# Patient Record
Sex: Female | Born: 1960 | Race: Black or African American | Marital: Single | Smoking: Current some day smoker
Health system: Southern US, Community
[De-identification: ages and names within clinical notes are randomized; demographics above are authoritative.]

## PROBLEM LIST (undated history)

## (undated) DIAGNOSIS — F1010 Alcohol abuse, uncomplicated: Secondary | ICD-10-CM

## (undated) DIAGNOSIS — R109 Unspecified abdominal pain: Secondary | ICD-10-CM

## (undated) DIAGNOSIS — K219 Gastro-esophageal reflux disease without esophagitis: Secondary | ICD-10-CM

## (undated) DIAGNOSIS — G629 Polyneuropathy, unspecified: Secondary | ICD-10-CM

## (undated) DIAGNOSIS — K7460 Unspecified cirrhosis of liver: Secondary | ICD-10-CM

## (undated) DIAGNOSIS — G8929 Other chronic pain: Secondary | ICD-10-CM

## (undated) DIAGNOSIS — F419 Anxiety disorder, unspecified: Secondary | ICD-10-CM

## (undated) DIAGNOSIS — F329 Major depressive disorder, single episode, unspecified: Secondary | ICD-10-CM

## (undated) DIAGNOSIS — F32A Depression, unspecified: Secondary | ICD-10-CM

## (undated) DIAGNOSIS — I509 Heart failure, unspecified: Secondary | ICD-10-CM

## (undated) HISTORY — DX: Polyneuropathy, unspecified: G62.9

## (undated) HISTORY — DX: Major depressive disorder, single episode, unspecified: F32.9

## (undated) HISTORY — DX: Depression, unspecified: F32.A

## (undated) HISTORY — DX: Heart failure, unspecified: I50.9

## (undated) HISTORY — DX: Gastro-esophageal reflux disease without esophagitis: K21.9

## (undated) HISTORY — PX: TUBAL LIGATION: SHX77

## (undated) HISTORY — DX: Anxiety disorder, unspecified: F41.9

---

## 2015-06-13 DIAGNOSIS — E639 Nutritional deficiency, unspecified: Secondary | ICD-10-CM | POA: Insufficient documentation

## 2015-08-05 ENCOUNTER — Telehealth: Payer: Self-pay | Admitting: Gastroenterology

## 2015-08-05 DIAGNOSIS — R17 Unspecified jaundice: Secondary | ICD-10-CM | POA: Insufficient documentation

## 2015-08-05 NOTE — Telephone Encounter (Signed)
PT HAS OPV NEXT WEEK. NEEDS CMP AND CT ABD/PELVIS BEFORE HER APPT PREFERABLY TODAY OR MON.

## 2015-08-05 NOTE — Telephone Encounter (Signed)
Patient called in and asked if I could call and leave a message with her caretaker concerning her upcoming appt.I called and lm on Whitney Roy's machine at [phone].

## 2015-08-05 NOTE — Assessment & Plan Note (Signed)
NEEDS LABS/CT ASAP

## 2015-08-05 NOTE — Telephone Encounter (Signed)
I called patient and she does not have any insurance. She stated that she does not understand and for me to call her friend([phone]) he helps take care of her. i had to leave a message for him to call me back. She is scheduled for the CT scan on Monday 08/08/15 @ 5:00. She needs to pick up contrast.

## 2015-08-08 ENCOUNTER — Ambulatory Visit (HOSPITAL_COMMUNITY)
Admission: RE | Admit: 2015-08-08 | Discharge: 2015-08-08 | Disposition: A | Discharge status: Home / Self Care | Payer: Medicaid Other | Source: Ambulatory Visit | Attending: Gastroenterology | Admitting: Gastroenterology

## 2015-08-08 DIAGNOSIS — R938 Abnormal findings on diagnostic imaging of other specified body structures: Secondary | ICD-10-CM | POA: Diagnosis not present

## 2015-08-08 DIAGNOSIS — K766 Portal hypertension: Secondary | ICD-10-CM | POA: Diagnosis not present

## 2015-08-08 DIAGNOSIS — R932 Abnormal findings on diagnostic imaging of liver and biliary tract: Secondary | ICD-10-CM | POA: Insufficient documentation

## 2015-08-08 DIAGNOSIS — R17 Unspecified jaundice: Secondary | ICD-10-CM

## 2015-08-08 MED ORDER — IOHEXOL 300 MG/ML  SOLN
100.0000 mL | Freq: Once | INTRAMUSCULAR | Status: AC | PRN
  Administered 2015-08-08: 100 mL via INTRAVENOUS

## 2015-08-09 NOTE — Progress Notes (Signed)
Quick Note:    Noted    ______

## 2015-08-10 ENCOUNTER — Ambulatory Visit (INDEPENDENT_AMBULATORY_CARE_PROVIDER_SITE_OTHER): Payer: Medicaid Other | Admitting: Gastroenterology

## 2015-08-10 ENCOUNTER — Encounter: Payer: Self-pay | Admitting: Gastroenterology

## 2015-08-10 VITALS — BP 112/55 | HR 85 | Temp 98.2°F | Ht 62.0 in | Wt 116.0 lb

## 2015-08-10 DIAGNOSIS — R7989 Other specified abnormal findings of blood chemistry: Secondary | ICD-10-CM

## 2015-08-10 DIAGNOSIS — D649 Anemia, unspecified: Secondary | ICD-10-CM | POA: Diagnosis not present

## 2015-08-10 DIAGNOSIS — R17 Unspecified jaundice: Secondary | ICD-10-CM | POA: Diagnosis not present

## 2015-08-10 DIAGNOSIS — R945 Abnormal results of liver function studies: Principal | ICD-10-CM

## 2015-08-11 ENCOUNTER — Encounter: Payer: Self-pay | Admitting: Gastroenterology

## 2015-08-11 ENCOUNTER — Telehealth: Payer: Self-pay | Admitting: Gastroenterology

## 2015-08-11 ENCOUNTER — Other Ambulatory Visit: Payer: Self-pay | Admitting: Gastroenterology

## 2015-08-11 DIAGNOSIS — D649 Anemia, unspecified: Secondary | ICD-10-CM | POA: Insufficient documentation

## 2015-08-11 LAB — IRON AND TIBC
%SAT: 52 % (ref 20–55)
IRON: 153 ug/dL — AB (ref 42–145)
TIBC: 295 ug/dL (ref 250–470)
UIBC: 142 ug/dL (ref 125–400)

## 2015-08-11 LAB — FERRITIN: FERRITIN: 249 ng/mL (ref 10–291)

## 2015-08-11 NOTE — Telephone Encounter (Signed)
Per Whitney Roy. She got the labs.

## 2015-08-11 NOTE — Progress Notes (Signed)
Primary Care Physician:  Wendee Beavers, NP Primary Gastroenterologist:  Dr. Oneida Alar   Chief Complaint  Patient presents with  . Jaundice    HPI:   Whitney Roy is a 54 y.o. female presenting today at the request of her PCP and Dr. Arnoldo Morale secondary to elevated LFTs, jaundice, cirrhosis. CT completed prior to appt on 8/22 showing a cirrhotic liver, portal venous collaterals and possible small esophageal varices. Unable to exclude hepatic fibrosis or acute hepatitis. Mesenteric, retroperitoneal and subcutaneous edema likely reflecting anasarca. NORMAL CBD and pancreatic duct.   US abdomen performed June 2016 with gallbladder sludge, thickened and irregular. Questionable nodular contour. Patent portal vein. Outside labs last done in May 2016 Tbili 9.6, ALT 11, AST 38, Alk Phos 102, Albumin 3.3, Na 132, Potassium 3.7, Hgb 9.9, Hct 30.5, MCV 103, Plts 181.   Poor historian. Family friend present with her. Notes abdominal swelling, chronic. Denies drinking ETOH for about a year. Prior to this would have about 4-5 beers per day. Denies drugs, prior blood transfusions, tattoos. Notes diffuse abdominal discomfort, waking her from sleep. Chronic. Notable confusion. Has noted yellowing of her eyes, urine is brown. Intermittent constipation, taking laxatives. Chronic reflux, improved with Zantac. No mlenea, no rectal bleeding. No prior colonoscopy/EGD. Appetite improved over the past month. Was weighing as little as 95 lbs but now back up to 116. Baseline 120s.   Past Medical History  Diagnosis Date  . CHF (congestive heart failure)   . GERD (gastroesophageal reflux disease)   . Neuropathy   . Anxiety   . Depression     Past Surgical History  Procedure Laterality Date  . Tubal ligation      Current Outpatient Prescriptions  Medication Sig Dispense Refill  . amitriptyline (ELAVIL) 10 MG tablet Take 10 mg by mouth at bedtime.    . Cholecalciferol (VITAMIN D-3 PO) Take by mouth.    .  citalopram (CELEXA) 20 MG tablet Take 20 mg by mouth.    . furosemide (LASIX) 20 MG tablet daily.     No current facility-administered medications for this visit.    Allergies as of 08/10/2015 - never reviewed  Allergen Reaction Noted  . Tylenol [acetaminophen]  08/08/2015    Family History  Problem Relation Age of Onset  . Colon cancer Father     unsure age of diagnosis  . Liver disease Neg Hx     Social History   Social History  . Marital Status: Unknown    Spouse Name: N/A  . Number of Children: N/A  . Years of Education: N/A   Occupational History  . Not on file.   Social History Main Topics  . Smoking status: Current Some Day Smoker    Types: Cigarettes  . Smokeless tobacco: Not on file  . Alcohol Use: 0.0 oz/week    0 Standard drinks or equivalent per week     Comment: SOBER For one year. Used to drink 4-5 beers daily.   . Drug Use: No  . Sexual Activity: Not on file   Other Topics Concern  . Not on file   Social History Narrative    Review of Systems: Gen: see HPI, +chills CV: Denies chest pain, heart palpitations, peripheral edema, syncope.  Resp: +DOE GI: see HPI GU : Denies urinary burning, urinary frequency, urinary hesitancy MS: Denies joint pain, muscle weakness, cramps, or limitation of movement.  Derm: Denies rash, itching, dry skin Psych: +depression, confusion  Heme: Denies bruising, bleeding, and enlarged lymph  nodes.  Physical Exam: BP 112/55 mmHg  Pulse 85  Temp(Src) 98.2 F (36.8 C) (Oral)  Ht 5' 2"  (1.575 m)  Wt 116 lb (52.617 kg)  BMI 21.21 kg/m2 General:   Alert and oriented. Appears chronically ill.  Head:  Normocephalic and atraumatic. Eyes:  +scleral icterus.  Ears:  Normal auditory acuity. Nose:  No deformity, discharge,  or lesions. Mouth:  No deformity or lesions, oral mucosa pink.  Lungs:  Clear to auscultation bilaterally. No wheezes, rales, or rhonchi. No distress.  Heart:  S1, S2 present without murmurs  appreciated.  Abdomen:  +BS, soft, rounded, questionable non-tense ascites, anasarca.+HSM Rectal:  Deferred  Msk:  Shuffling gait, using walker Extremities:  Without  edema. Neurologic:  Alert and  oriented x4;  grossly normal neurologically. Psych:  Alert and cooperative. Normal mood and affect.   CT Aug 2016:  IMPRESSION: 1. Cirrhotic changes involving the liver with portal venous hypertension, portal venous collaterals and possible small esophageal varices. 2. Patchy areas of low-attenuation in the liver suspicious for fatty change but could not exclude confluent hepatic fibrosis or acute hepatitis. MRI abdomen without and with contrast may be helpful for further evaluation. 3. No overt ascites but there is mesenteric and retroperitoneal and subcutaneous edema which could reflect anasarca are related to the patient's liver disease. 4. Normal common bile duct and pancreatic duct. No obvious gallstones. 5. Suspect exophytic uterine fibroids.

## 2015-08-11 NOTE — Telephone Encounter (Signed)
Patient was seen yesterday in clinic at 3:30pm. The computers were down, so I was unable to get any labs ordered. They are going today.  Please fax labs to [facility]. I have printed them out. Thanks!

## 2015-08-11 NOTE — Telephone Encounter (Signed)
The lab is aware to do all of the labs.

## 2015-08-11 NOTE — Telephone Encounter (Signed)
I have ordered iron studies as well. Please fax. I forgot to order this. Thanks!

## 2015-08-11 NOTE — Telephone Encounter (Signed)
Pt was seen yesterday.

## 2015-08-12 LAB — PROTIME-INR
INR: 2.2 — ABNORMAL HIGH (ref <1.50)
Prothrombin Time: 24.4 seconds — ABNORMAL HIGH (ref 11.6–15.2)

## 2015-08-12 LAB — CBC WITH DIFFERENTIAL/PLATELET
BASOS ABS: 0.3 10*3/uL — AB (ref 0.0–0.1)
Basophils Relative: 3 % — ABNORMAL HIGH (ref 0–1)
Eosinophils Absolute: 0.6 10*3/uL (ref 0.0–0.7)
Eosinophils Relative: 7 % — ABNORMAL HIGH (ref 0–5)
HEMATOCRIT: 27.9 % — AB (ref 36.0–46.0)
Hemoglobin: 9.9 g/dL — ABNORMAL LOW (ref 12.0–15.0)
LYMPHS ABS: 1.9 10*3/uL (ref 0.7–4.0)
LYMPHS PCT: 23 % (ref 12–46)
MCH: 32.1 pg (ref 26.0–34.0)
MCHC: 35.5 g/dL (ref 30.0–36.0)
MCV: 90.6 fL (ref 78.0–100.0)
MPV: 10.1 fL (ref 8.6–12.4)
Monocytes Absolute: 0.8 10*3/uL (ref 0.1–1.0)
Monocytes Relative: 10 % (ref 3–12)
NEUTROS PCT: 57 % (ref 43–77)
Neutro Abs: 4.8 10*3/uL (ref 1.7–7.7)
PLATELETS: 102 10*3/uL — AB (ref 150–400)
RBC: 3.08 MIL/uL — ABNORMAL LOW (ref 3.87–5.11)
RDW: 15.7 % — ABNORMAL HIGH (ref 11.5–15.5)
WBC: 8.4 10*3/uL (ref 4.0–10.5)

## 2015-08-12 LAB — HEPATITIS PANEL, ACUTE
HCV Ab: REACTIVE — AB
HEP A IGM: NONREACTIVE
HEP B C IGM: NONREACTIVE
HEP B S AG: NEGATIVE

## 2015-08-12 LAB — COMPLETE METABOLIC PANEL WITH GFR
ALT: 14 U/L (ref 6–29)
AST: 47 U/L — ABNORMAL HIGH (ref 10–35)
Albumin: 3.4 g/dL — ABNORMAL LOW (ref 3.6–5.1)
Alkaline Phosphatase: 202 U/L — ABNORMAL HIGH (ref 33–130)
BUN: 10 mg/dL (ref 7–25)
CHLORIDE: 101 mmol/L (ref 98–110)
CO2: 24 mmol/L (ref 20–31)
Calcium: 9.8 mg/dL (ref 8.6–10.4)
Creat: 0.55 mg/dL (ref 0.50–1.05)
Glucose, Bld: 92 mg/dL (ref 65–99)
Potassium: 4.3 mmol/L (ref 3.5–5.3)
Sodium: 132 mmol/L — ABNORMAL LOW (ref 135–146)
Total Bilirubin: 12.1 mg/dL — ABNORMAL HIGH (ref 0.2–1.2)
Total Protein: 7.7 g/dL (ref 6.1–8.1)

## 2015-08-12 LAB — ANTI-SMOOTH MUSCLE ANTIBODY, IGG: Smooth Muscle Ab: 24 U — ABNORMAL HIGH (ref <20)

## 2015-08-12 LAB — IGG, IGA, IGM
IGA: 561 mg/dL — AB (ref 69–380)
IGM, SERUM: 605 mg/dL — AB (ref 52–322)
IgG (Immunoglobin G), Serum: 2480 mg/dL — ABNORMAL HIGH (ref 690–1700)

## 2015-08-12 LAB — ANA: ANA: NEGATIVE

## 2015-08-15 LAB — HEPATITIS C RNA QUANTITATIVE

## 2015-08-15 LAB — MITOCHONDRIAL ANTIBODIES: MITOCHONDRIAL M2 AB, IGG: 1.1 — AB (ref <0.91)

## 2015-08-16 ENCOUNTER — Encounter: Payer: Self-pay | Admitting: Gastroenterology

## 2015-08-16 DIAGNOSIS — R945 Abnormal results of liver function studies: Principal | ICD-10-CM

## 2015-08-16 DIAGNOSIS — R7989 Other specified abnormal findings of blood chemistry: Secondary | ICD-10-CM | POA: Insufficient documentation

## 2015-08-16 MED ORDER — LACTULOSE 10 GM/15ML PO SOLN: 10.0000 g | Freq: Three times a day (TID) | ORAL | Status: DC

## 2015-08-16 NOTE — Assessment & Plan Note (Signed)
Query chronic disease +/- iron deficiency. No overt GI bleeding. Check anemia panel now. No prior colonoscopy, and POSITIVE FAMILY HISTORY OF COLON CANCER IN FATHER, onset of disease unknown. Needs colonoscopy at time of EGD if clinically able to tolerate.

## 2015-08-16 NOTE — Assessment & Plan Note (Addendum)
54 year old female with significantly elevated bilirubin and CT findings of cirrhosis in the setting of historical ETOH abuse; other etiologies for cirrhosis including viral hepatitis, autoimmune etiology need to be investigated. Appears last bloodwork done in May. We will repeat HFP today, add INR, CBC, autoimmune serologies, viral markers, and INR. Ultimately needs EGD for variceal assessment, which will need to be done with Propofol. Intermittent confusion, most likely encephalopathy based on interaction with patient today and reported cognitive history and caretaker reports. Start Lactulose BID with goal of 2-3 soft BMs daily.

## 2015-08-16 NOTE — Progress Notes (Signed)
CC'ED TO PCP 

## 2015-08-16 NOTE — Progress Notes (Signed)
Quick Note:  I called [facility] and spoke to [place] who added the ceruloplasmin to the labs. I spoke to Gerrit Halls, NP and she is gong to call and speak with the pt. ______

## 2015-08-16 NOTE — Progress Notes (Signed)
Quick Note:  MELD 25. She has advanced liver disease. She needs a referral for transplant evaluation ASAP to [place], [place], or [place], whichever she is able to get to.  HOLD on EGD and colonoscopy right now. This can be done at the transplant center. Her INR is in the 2 range, indicating advanced liver disease.  Please add on ceruloplasmin to labs if able. If not, needs to have this drawn. ______

## 2015-08-16 NOTE — Progress Notes (Signed)
Quick Note:  I was unable to speak to patient directly, but I spoke with Kris Mouton, contact person for patient. He is attempting to get insurance worked out for patient. PLEASE REFER TO [facility] ASAP, TRANSPLANT EVALUATION. PLEASE PUT ON REFERRAL SHEET THAT HER MELD SCORE IS 25.  Patient needs to be seen in 2 weeks here. ______

## 2015-08-16 NOTE — Addendum Note (Signed)
Addended by: Nira Retort on: 08/16/2015 04:40 PM   Modules accepted: Orders

## 2015-08-17 NOTE — Progress Notes (Signed)
Quick Note:  Forwarding to [facility] to make referral and to [facility] to make appt here in 2 weeks. ______

## 2015-08-17 NOTE — Progress Notes (Signed)
APPOINTMENT MADE AND GIVEN TO CONTACT IN SYSTEM, Whitney Roy.  HE IS AWARE OF DATE AND TIME.

## 2015-08-18 LAB — CERULOPLASMIN: CERULOPLASMIN: 32 mg/dL (ref 18–53)

## 2015-08-24 NOTE — Progress Notes (Signed)
REFER PT TO [facility]. EXPLAIN TO PT SHE NEEDS TO OBTAIN INSURANCE.

## 2015-08-25 ENCOUNTER — Other Ambulatory Visit: Payer: Self-pay | Admitting: Gastroenterology

## 2015-08-25 MED ORDER — URSODIOL 250 MG PO TABS: 250.0000 mg | ORAL_TABLET | Freq: Two times a day (BID) | ORAL | Status: DC

## 2015-08-25 MED ORDER — PREDNISONE 10 MG PO TABS: 10.0000 mg | ORAL_TABLET | Freq: Every day | ORAL | Status: DC

## 2015-08-25 MED ORDER — URSODIOL 250 MG PO TABS: 250.0000 mg | ORAL_TABLET | Freq: Two times a day (BID) | ORAL | Status: AC

## 2015-08-25 NOTE — Addendum Note (Signed)
Addended by: Nira Retort on: 08/25/2015 12:02 PM   Modules accepted: Orders

## 2015-08-25 NOTE — Progress Notes (Signed)
Quick Note:  I have sent Prednisone 10 mg daily and Urso 250 mg po BID to pharmacy. I am copying Raynelle Fanning on this. Prednisone should be inexpensive but urso will need patient assistance. ______

## 2015-08-26 NOTE — Progress Notes (Signed)
Quick Note:  I called and informed contact person, Kris Mouton of the prednisone and the paperwork that Raynelle Fanning is mailing for the URSO. ______

## 2015-08-30 ENCOUNTER — Ambulatory Visit: Payer: Self-pay | Admitting: Gastroenterology

## 2015-08-30 NOTE — Progress Notes (Signed)
Quick Note:  Ceruloplasmin normal. Doubt dealing with Wilson's disease. How is referral coming along? ______

## 2015-08-30 NOTE — Progress Notes (Signed)
Quick Note:  LMOM for pt and Kris Mouton for a return call. Ginger is checking on the referral process. ______

## 2015-08-31 ENCOUNTER — Other Ambulatory Visit: Payer: Self-pay

## 2015-08-31 ENCOUNTER — Ambulatory Visit (INDEPENDENT_AMBULATORY_CARE_PROVIDER_SITE_OTHER): Payer: Medicaid Other | Admitting: Gastroenterology

## 2015-08-31 ENCOUNTER — Encounter: Payer: Self-pay | Admitting: Gastroenterology

## 2015-08-31 VITALS — BP 136/60 | HR 90 | Temp 97.7°F | Ht 60.0 in | Wt 116.4 lb

## 2015-08-31 DIAGNOSIS — K7031 Alcoholic cirrhosis of liver with ascites: Secondary | ICD-10-CM

## 2015-08-31 DIAGNOSIS — K7030 Alcoholic cirrhosis of liver without ascites: Secondary | ICD-10-CM | POA: Insufficient documentation

## 2015-08-31 DIAGNOSIS — R188 Other ascites: Secondary | ICD-10-CM

## 2015-08-31 MED ORDER — LACTULOSE 10 GM/15ML PO SOLN: ORAL | Status: AC

## 2015-08-31 NOTE — Patient Instructions (Signed)
We have scheduled you for an ultrasound of your belly to see if any fluid can be removed. Please have your blood work done at [facility] lab across from the hospital on the day that you have your ultrasound done. I just don't want you to have to drive back and forth multiple times.   I have refilled lactulose to take 15mL to 30 mL three to four times a day with a goal of 2-3 soft bowel movements daily.

## 2015-08-31 NOTE — Progress Notes (Addendum)
Referring Provider: Newman Nip, NP Primary Care Physician:  Newman Nip, NP  Primary GI: Dr. Darrick Penna   Chief Complaint  Patient presents with  . Follow-up    HPI:   Whitney Roy is a 54 y.o. female presenting today with a history of decompensated cirrhosis, query an autoimmune component, notable hx of ETOH abuse now sober for 1 year, awaiting a visit at [facility] but has been difficult due to LACK OF INSURANCE. Friend is present with patient. Lenora Boys, contact at [employer]. Attempting to obtain Medicaid. Trying to complete forms for patient assistance. Started on low dose prednisone 10 mg daily in interim from last visit, unable to afford urso as it is 200 $ a month.   Was having 2 BMs per day but last few days more constipated. Had prune juice. Was taking lactulose TID but then stopped working. Sometimes abdominal pain wakes her up out of sleep. Feels tight. Abdomen feels more swollen. Actually appears better today than last visit. Still with intermittent confusion.   Past Medical History  Diagnosis Date  . CHF (congestive heart failure)   . GERD (gastroesophageal reflux disease)   . Neuropathy   . Anxiety   . Depression     Past Surgical History  Procedure Laterality Date  . Tubal ligation      Current Outpatient Prescriptions  Medication Sig Dispense Refill  . amitriptyline (ELAVIL) 10 MG tablet Take 10 mg by mouth at bedtime.    . Cholecalciferol (VITAMIN D-3 PO) Take by mouth.    . citalopram (CELEXA) 20 MG tablet Take 20 mg by mouth.    . lactulose (CHRONULAC) 10 GM/15ML solution Take 15ml to 30 ml three to four times a day to have 2-3 soft bowel movements. 1892 mL 3  . predniSONE (DELTASONE) 10 MG tablet Take 1 tablet (10 mg total) by mouth daily with breakfast. 90 tablet 3  . furosemide (LASIX) 20 MG tablet daily.    . ursodiol (URSO) 250 MG tablet Take 1 tablet (250 mg total) by mouth 2 (two) times daily. 60 tablet 3     No current facility-administered medications for this visit.    Allergies as of 08/31/2015 - Review Complete 08/31/2015  Allergen Reaction Noted  . Tylenol [acetaminophen]  08/08/2015    Family History  Problem Relation Age of Onset  . Colon cancer Father     unsure age of diagnosis  . Liver disease Neg Hx     Social History   Social History  . Marital Status: Single    Spouse Name: N/A  . Number of Children: N/A  . Years of Education: N/A   Social History Main Topics  . Smoking status: Current Some Day Smoker    Types: Cigarettes  . Smokeless tobacco: None  . Alcohol Use: 0.0 oz/week    0 Standard drinks or equivalent per week     Comment: SOBER For one year. Used to drink 4-5 beers daily.   . Drug Use: No  . Sexual Activity: Not Asked   Other Topics Concern  . None   Social History Narrative    Review of Systems: As mentioned in HPI   Physical Exam: BP 136/60 mmHg  Pulse 90  Temp(Src) 97.7 F (36.5 C)  Ht 5' (1.524 m)  Wt 116 lb 6.4 oz (52.799 kg)  BMI 22.73 kg/m2 General:   Alert and oriented. Appears chronically ill.  Head:  Normocephalic and atraumatic. Eyes:  Scleral icterus  Mouth:  Oral mucosa pink and moist.  Abdomen:  +BS, distended, mild to moderately tense. +fluid wave Msk:  Shuffled gait Extremities:  Without edema. Neurologic:  Alert and  oriented x4;  grossly normal neurologically. Psych:  Alert and cooperative. Normal mood and affect.  Lab Results  Component Value Date   WBC 8.4 08/11/2015   HGB 9.9* 08/11/2015   HCT 27.9* 08/11/2015   MCV 90.6 08/11/2015   PLT 102* 08/11/2015   Lab Results  Component Value Date   CREATININE 0.55 08/11/2015   BUN 10 08/11/2015   NA 132* 08/11/2015   K 4.3 08/11/2015   CL 101 08/11/2015   CO2 24 08/11/2015   Lab Results  Component Value Date   ALT 14 08/11/2015   AST 47* 08/11/2015   ALKPHOS 202* 08/11/2015   BILITOT 12.1* 08/11/2015   Lab Results  Component Value Date   INR 2.20*  08/11/2015

## 2015-09-01 ENCOUNTER — Telehealth: Payer: Self-pay | Admitting: General Practice

## 2015-09-01 NOTE — Telephone Encounter (Signed)
Disability paperwork is still pending and should have a decision by Monday or Tuesday of next week.  Whitney Roy [phone]

## 2015-09-01 NOTE — Telephone Encounter (Signed)
Gardenia Phlegm is the patient's social worker and Ashley([employer]) will call to follow-up on her disability application

## 2015-09-01 NOTE — Telephone Encounter (Signed)
I spoke with Whitney Roy at [facility] and she stated the patient has an application submitted for disability and [facility], however there has not been a decision made as of yet.

## 2015-09-02 ENCOUNTER — Other Ambulatory Visit: Payer: Self-pay | Admitting: Gastroenterology

## 2015-09-02 ENCOUNTER — Ambulatory Visit (HOSPITAL_COMMUNITY)
Admission: RE | Admit: 2015-09-02 | Discharge: 2015-09-02 | Disposition: A | Discharge status: Home / Self Care | Payer: Medicaid Other | Source: Ambulatory Visit | Attending: Gastroenterology | Admitting: Gastroenterology

## 2015-09-02 DIAGNOSIS — R188 Other ascites: Secondary | ICD-10-CM

## 2015-09-02 NOTE — Assessment & Plan Note (Addendum)
54 year old female with decompensated cirrhosis and in need of evaluation for liver transplant. Unfortunately, this has been quite a hurdle due to lack of insurance. Caretaker in process of Medicaid application with [place] [employer] Lenora Boys), and attempting to obtain [facility]. 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 Complex case and needs transplant evaluation as soon as possible. Per [facility] liver clinic, if patient requires hospitalization, would be best served going to the nearest ED with transplant capabilities, as this would enable her to be considered a candidate. Will continue to follow closely here for clinical changes and provide supportive care. Physical exam with concern for ascites. Due to most likely encephalopathy underlying with intermittent confusion, titrate lactulose dosing, which was started at last visit.   1. Increase lactulose dosing to 15-30 ml 3-4 times a day with goal of 2-3 bowel movements daily 2. [place] with para if ascites able to be tapped. Fluid analysis. Albumin 25 g IV at 4 liters 3. Recheck  INR, CMP 4. Continue prednisone 10 mg daily. Have attempted to obtain urso but without any breakthrough due to lack of insurance, lack of program assistance through company.

## 2015-09-03 LAB — COMPLETE METABOLIC PANEL WITH GFR
ALBUMIN: 3.4 g/dL — AB (ref 3.6–5.1)
ALK PHOS: 207 U/L — AB (ref 33–130)
ALT: 24 U/L (ref 6–29)
AST: 47 U/L — ABNORMAL HIGH (ref 10–35)
BILIRUBIN TOTAL: 12.8 mg/dL — AB (ref 0.2–1.2)
BUN: 10 mg/dL (ref 7–25)
CO2: 27 mmol/L (ref 20–31)
Calcium: 9 mg/dL (ref 8.6–10.4)
Chloride: 99 mmol/L (ref 98–110)
Creat: 0.63 mg/dL (ref 0.50–1.05)
Glucose, Bld: 129 mg/dL — ABNORMAL HIGH (ref 65–99)
POTASSIUM: 4 mmol/L (ref 3.5–5.3)
Sodium: 136 mmol/L (ref 135–146)
TOTAL PROTEIN: 7.7 g/dL (ref 6.1–8.1)

## 2015-09-03 LAB — PROTIME-INR
INR: 2.35 — AB (ref <1.50)
PROTHROMBIN TIME: 26.1 s — AB (ref 11.6–15.2)

## 2015-09-07 NOTE — Progress Notes (Signed)
CC'D TO PCP °

## 2015-09-07 NOTE — Telephone Encounter (Signed)
I called and lmom for Whitney Roy to f/u on the patient's application

## 2015-09-08 NOTE — Telephone Encounter (Signed)
Per Eileen Stanford she is still pending and should have an answer soon

## 2015-09-13 NOTE — Telephone Encounter (Signed)
Per Whitney Roy this process could take up to 6 months before a determination could be made from [employer]

## 2015-09-13 NOTE — Progress Notes (Signed)
Quick Note:  MELD 26, was 25 at first consultation. Tbili only slightly worse, overall LFTs remain similar. INR worsening. PATIENT NEEDS TRANSPLANT EVALUATION. I know she is waiting on Medicaid approval; Durward Mallard has been in contact with Maralyn Sago, Child psychotherapist. This patient needs insurance ASAP in some form. Needs to be seen at [facility]. Can we see what the status is of insurance? If I need to talk with Eileen Stanford, let me know. This case should be pushed to the forefront for medical purposes. ______

## 2015-09-13 NOTE — Telephone Encounter (Signed)
I tried to call the [facility] in [place] at [phone], however I was unable to reach anyone.  I will try back tomorrow.

## 2015-09-13 NOTE — Telephone Encounter (Signed)
I spoke with Whitney Roy at [employer] in regards to speeding up the process for [name]'s Medicaid application.

## 2015-09-13 NOTE — Telephone Encounter (Signed)
There is not a way to speed up this process

## 2015-09-14 ENCOUNTER — Other Ambulatory Visit: Payer: Self-pay

## 2015-09-14 ENCOUNTER — Encounter: Payer: Self-pay | Admitting: Gastroenterology

## 2015-09-14 DIAGNOSIS — K7460 Unspecified cirrhosis of liver: Secondary | ICD-10-CM

## 2015-09-14 NOTE — Telephone Encounter (Signed)
I spoke with Whitney Roy and informed of information thus far. He is thankful for all we have done. He knows about appt with me in Oct.

## 2015-09-14 NOTE — Progress Notes (Signed)
US abdomen on 9/16 reviewed. No evidence of ascites. With distended abdomen, most likely anasarca. Hypoalbuminemia and chronic disease playing a role. Anasarca noted on CT in Aug 08, 2015.

## 2015-09-14 NOTE — Telephone Encounter (Signed)
I tried to call the patient, no answer,lmom 

## 2015-09-14 NOTE — Telephone Encounter (Signed)
I spoke with Whitney Roy with [employer] at [phone] concerning Whitney Roy's claim #[id number].  I asked if my provider Gerrit Halls could speak with their doctor(Dr. Nelly Laurence) in regards to her claim.    Routing to [employer]

## 2015-09-14 NOTE — Telephone Encounter (Signed)
Patient is scheduled to see Tobi Bastos 10/24th at 11:30, Tyler Aas is going to mail labs to have INR drawn on 10/24th

## 2015-09-14 NOTE — Telephone Encounter (Signed)
Spoke with Annamarie Major, NP.   1. Patient will pay 50$ at liver clinic in [place] to see hepatologist BUT WILL STILL BE BILLED FOR REMAINDER.  2. Transplant work-up will be costly out of pocket and impossible without insurance. HOWEVER, getting her in at [place] with the hepatologist is best option as social work can get involved and help navigate the system. 3. For continued follow-ups, [facility] can see her BUT this would be completely out of pocket.  4. Could potentially consider Ocaliva 5 mg weekly, as she is unable to afford Delma Freeze and Prudencio Pair reportedly has patient assistance. HOWEVER:  this is indicated if failure of urso or intolerance to urso. I don't feel comfortable starting this without being seen in [place]. They may be able to offer better options. IF the appt takes awhile, I will talk with Dr. Darrick Penna about starting this medication now.   Bottom line: without insurance, the work-up and post-transplant care is impossible and her prognosis is poor. She needs insurance so she can continue with medications post-transplant. She needs evaluation ASAP regardless, and getting her in with a hepatologist at [facility] is the best option right now, with further management hopefully by [facility]. (This will be out of pocket unless she gets coverage). We will help where we can.

## 2015-09-14 NOTE — Telephone Encounter (Signed)
Lab order is being mailed to pt along with the appt for 10/10/2015 at 11:30 AM.  A letter to explain is enclosed.

## 2015-09-14 NOTE — Telephone Encounter (Signed)
I spoke with Whitney Roy and she is going to call Alvis Lemmings is [facility] in [place] to see about starting care there to manager her symptoms.

## 2015-09-14 NOTE — Telephone Encounter (Signed)
Letter has been faxed to Dr. Carleene Cooper, along with supporting documentation. Received call back from Dr. Carleene Cooper that according to policy, an INR must be greater than 1.5 on 2 separate occasions, 60 days apart, within past 6 months. Unfortunately, we do not have any record of an INR drawn prior to initial visit with [place].   She is going to do an "equals listing", meaning she will document that we can assume her INR was greater than 1.5 for at least 60 days prior. This may hold up the process. We will attempt this.   PLAN B: obtain repeat INR 60 days from 8/25, which will be around October 24th. Hopefully, the equal listing will work, but if not, we need an INR exactly on Oct 24th. Then, we can fax to Dr. Carleene Cooper.   HER DIRECT PHONE # IS: [phone].   Doris: let's just bring the patient in on Oct 24th with me. May use an urgent if needed.

## 2015-09-14 NOTE — Telephone Encounter (Signed)
I spoke with Edman Circle at [phone], financial counselor and she said that the patient could be seen in the [facility] Clinic and they can start managing the symptoms.  They will make her self pay and she will be responsible for a $50 payment for every office visit.  After that consultation they will determine if she is a candidate for transplant, however she will need to have some insurance.

## 2015-09-19 NOTE — Telephone Encounter (Signed)
I made Mr. Whitney Roy aware of Ms. [employer] approval.  He wanted to know if we can make the liver referral somewhere closer to home rather than [place].   Routing to clinical pool

## 2015-09-19 NOTE — Telephone Encounter (Signed)
Called Mr.Garland LMOM for him to call office back

## 2015-09-19 NOTE — Telephone Encounter (Signed)
WONDERFUL NEWS! Great team effort from everyone. This is a big deal and was quite a challenge. Proud of everyone's efforts.    Yes: she may go to a closer transplant center but needs to go ASAP.

## 2015-09-19 NOTE — Telephone Encounter (Signed)
I just received an email from [facility], stating the patient has been approved for Medicaid and coverage has been added to her record.  Tobi Bastos, would you like to see if we can rush her appt at the Liver Clinic in [place]?

## 2015-09-19 NOTE — Telephone Encounter (Signed)
Patient prefers Monday, Wednesday of Friday due to transportation issues.

## 2015-09-22 NOTE — Telephone Encounter (Signed)
Spoke with Mr Roanna Epley. He is aware.

## 2015-09-30 ENCOUNTER — Encounter (HOSPITAL_COMMUNITY): Payer: Self-pay | Admitting: Emergency Medicine

## 2015-09-30 ENCOUNTER — Emergency Department (HOSPITAL_COMMUNITY): Payer: Medicaid Other

## 2015-09-30 ENCOUNTER — Observation Stay (HOSPITAL_COMMUNITY)
Admission: EM | Admit: 2015-09-30 | Discharge: 2015-10-02 | Disposition: A | Discharge status: Home / Self Care | Payer: Medicaid Other | Attending: Family Medicine | Admitting: Family Medicine

## 2015-09-30 DIAGNOSIS — R410 Disorientation, unspecified: Secondary | ICD-10-CM

## 2015-09-30 DIAGNOSIS — Z720 Tobacco use: Secondary | ICD-10-CM | POA: Diagnosis not present

## 2015-09-30 DIAGNOSIS — R531 Weakness: Secondary | ICD-10-CM | POA: Diagnosis not present

## 2015-09-30 DIAGNOSIS — K7030 Alcoholic cirrhosis of liver without ascites: Secondary | ICD-10-CM | POA: Diagnosis present

## 2015-09-30 DIAGNOSIS — Z789 Other specified health status: Secondary | ICD-10-CM

## 2015-09-30 DIAGNOSIS — K7460 Unspecified cirrhosis of liver: Secondary | ICD-10-CM | POA: Insufficient documentation

## 2015-09-30 DIAGNOSIS — J159 Unspecified bacterial pneumonia: Secondary | ICD-10-CM | POA: Diagnosis not present

## 2015-09-30 DIAGNOSIS — Z7289 Other problems related to lifestyle: Secondary | ICD-10-CM | POA: Insufficient documentation

## 2015-09-30 DIAGNOSIS — K219 Gastro-esophageal reflux disease without esophagitis: Secondary | ICD-10-CM | POA: Diagnosis not present

## 2015-09-30 DIAGNOSIS — F329 Major depressive disorder, single episode, unspecified: Secondary | ICD-10-CM | POA: Insufficient documentation

## 2015-09-30 DIAGNOSIS — F1010 Alcohol abuse, uncomplicated: Secondary | ICD-10-CM

## 2015-09-30 DIAGNOSIS — G8929 Other chronic pain: Secondary | ICD-10-CM | POA: Insufficient documentation

## 2015-09-30 DIAGNOSIS — I509 Heart failure, unspecified: Secondary | ICD-10-CM | POA: Insufficient documentation

## 2015-09-30 DIAGNOSIS — J189 Pneumonia, unspecified organism: Secondary | ICD-10-CM

## 2015-09-30 DIAGNOSIS — G629 Polyneuropathy, unspecified: Secondary | ICD-10-CM | POA: Diagnosis not present

## 2015-09-30 DIAGNOSIS — G9340 Encephalopathy, unspecified: Secondary | ICD-10-CM

## 2015-09-30 DIAGNOSIS — F419 Anxiety disorder, unspecified: Secondary | ICD-10-CM | POA: Insufficient documentation

## 2015-09-30 DIAGNOSIS — R509 Fever, unspecified: Secondary | ICD-10-CM

## 2015-09-30 DIAGNOSIS — Z79899 Other long term (current) drug therapy: Secondary | ICD-10-CM | POA: Diagnosis not present

## 2015-09-30 DIAGNOSIS — R4182 Altered mental status, unspecified: Secondary | ICD-10-CM | POA: Diagnosis present

## 2015-09-30 HISTORY — DX: Unspecified abdominal pain: R10.9

## 2015-09-30 HISTORY — DX: Unspecified cirrhosis of liver: K74.60

## 2015-09-30 HISTORY — DX: Other chronic pain: G89.29

## 2015-09-30 LAB — URINE MICROSCOPIC-ADD ON

## 2015-09-30 LAB — URINALYSIS, ROUTINE W REFLEX MICROSCOPIC
Glucose, UA: NEGATIVE mg/dL
KETONES UR: NEGATIVE mg/dL
Leukocytes, UA: NEGATIVE
NITRITE: NEGATIVE
Protein, ur: NEGATIVE mg/dL
SPECIFIC GRAVITY, URINE: 1.015 (ref 1.005–1.030)
UROBILINOGEN UA: 4 mg/dL — AB (ref 0.0–1.0)
pH: 6.5 (ref 5.0–8.0)

## 2015-09-30 LAB — ETHANOL: Alcohol, Ethyl (B): 47 mg/dL — ABNORMAL HIGH (ref <5)

## 2015-09-30 LAB — COMPREHENSIVE METABOLIC PANEL
ALBUMIN: 3.1 g/dL — AB (ref 3.5–5.0)
ALK PHOS: 119 U/L (ref 38–126)
ALT: 34 U/L (ref 14–54)
ANION GAP: 12 (ref 5–15)
AST: 76 U/L — AB (ref 15–41)
BILIRUBIN TOTAL: 14.6 mg/dL — AB (ref 0.3–1.2)
BUN: 10 mg/dL (ref 6–20)
CALCIUM: 8.8 mg/dL — AB (ref 8.9–10.3)
CO2: 22 mmol/L (ref 22–32)
CREATININE: 0.52 mg/dL (ref 0.44–1.00)
Chloride: 100 mmol/L — ABNORMAL LOW (ref 101–111)
GFR calc Af Amer: >60 mL/min (ref >60)
GFR calc non Af Amer: >60 mL/min (ref >60)
GLUCOSE: 114 mg/dL — AB (ref 65–99)
Potassium: 3.6 mmol/L (ref 3.5–5.1)
SODIUM: 134 mmol/L — AB (ref 135–145)
TOTAL PROTEIN: 8 g/dL (ref 6.5–8.1)

## 2015-09-30 LAB — RAPID URINE DRUG SCREEN, HOSP PERFORMED
AMPHETAMINES: NOT DETECTED
BARBITURATES: NOT DETECTED
Benzodiazepines: POSITIVE — AB
Cocaine: NOT DETECTED
Opiates: NOT DETECTED
TETRAHYDROCANNABINOL: NOT DETECTED

## 2015-09-30 LAB — CBC WITH DIFFERENTIAL/PLATELET
BASOS PCT: 2 %
Basophils Absolute: 0.2 10*3/uL — ABNORMAL HIGH (ref 0.0–0.1)
Eosinophils Absolute: 0.1 10*3/uL (ref 0.0–0.7)
Eosinophils Relative: 1 %
HEMATOCRIT: 31.4 % — AB (ref 36.0–46.0)
HEMOGLOBIN: 10.8 g/dL — AB (ref 12.0–15.0)
Lymphocytes Relative: 18 %
Lymphs Abs: 1.7 10*3/uL (ref 0.7–4.0)
MCH: 33.2 pg (ref 26.0–34.0)
MCHC: 34.4 g/dL (ref 30.0–36.0)
MCV: 96.6 fL (ref 78.0–100.0)
MONOS PCT: 15 %
Monocytes Absolute: 1.5 10*3/uL — ABNORMAL HIGH (ref 0.1–1.0)
NEUTROS ABS: 6.3 10*3/uL (ref 1.7–7.7)
NEUTROS PCT: 64 %
Platelets: 109 10*3/uL — ABNORMAL LOW (ref 150–400)
RBC: 3.25 MIL/uL — AB (ref 3.87–5.11)
RDW: 18.3 % — ABNORMAL HIGH (ref 11.5–15.5)
WBC: 9.7 10*3/uL (ref 4.0–10.5)

## 2015-09-30 LAB — MAGNESIUM: MAGNESIUM: 1.6 mg/dL — AB (ref 1.7–2.4)

## 2015-09-30 LAB — PROTIME-INR
INR: 2.21 — ABNORMAL HIGH (ref 0.00–1.49)
PROTHROMBIN TIME: 24.3 s — AB (ref 11.6–15.2)

## 2015-09-30 LAB — AMMONIA: Ammonia: 40 umol/L — ABNORMAL HIGH (ref 9–35)

## 2015-09-30 MED ORDER — VITAMIN B-1 100 MG PO TABS
100.0000 mg | ORAL_TABLET | Freq: Every day | ORAL | Status: DC
  Administered 2015-09-30 – 2015-10-02 (×3): 100 mg via ORAL
  Filled 2015-09-30 (×4): qty 1

## 2015-09-30 MED ORDER — PNEUMOCOCCAL VAC POLYVALENT 25 MCG/0.5ML IJ INJ
0.5000 mL | INJECTION | INTRAMUSCULAR | Status: AC
  Administered 2015-10-01: 0.5 mL via INTRAMUSCULAR
  Filled 2015-09-30: qty 0.5

## 2015-09-30 MED ORDER — CITALOPRAM HYDROBROMIDE 20 MG PO TABS
20.0000 mg | ORAL_TABLET | Freq: Every day | ORAL | Status: DC
  Administered 2015-10-01 – 2015-10-02 (×2): 20 mg via ORAL
  Filled 2015-09-30 (×4): qty 1

## 2015-09-30 MED ORDER — FUROSEMIDE 20 MG PO TABS
20.0000 mg | ORAL_TABLET | Freq: Every day | ORAL | Status: DC
  Administered 2015-10-01 – 2015-10-02 (×2): 20 mg via ORAL
  Filled 2015-09-30 (×4): qty 1

## 2015-09-30 MED ORDER — URSODIOL 250 MG PO TABS
250.0000 mg | ORAL_TABLET | Freq: Two times a day (BID) | ORAL | Status: DC
  Filled 2015-09-30 (×5): qty 1

## 2015-09-30 MED ORDER — LACTULOSE 10 GM/15ML PO SOLN
10.0000 g | Freq: Three times a day (TID) | ORAL | Status: DC
  Administered 2015-09-30 – 2015-10-01 (×4): 10 g via ORAL
  Filled 2015-09-30 (×5): qty 30

## 2015-09-30 MED ORDER — LORAZEPAM 1 MG PO TABS: 1.0000 mg | ORAL_TABLET | Freq: Four times a day (QID) | ORAL | Status: DC | PRN

## 2015-09-30 MED ORDER — ADULT MULTIVITAMIN W/MINERALS CH
1.0000 | ORAL_TABLET | Freq: Every day | ORAL | Status: DC
  Administered 2015-09-30 – 2015-10-02 (×3): 1 via ORAL
  Filled 2015-09-30 (×4): qty 1

## 2015-09-30 MED ORDER — PREDNISONE 10 MG PO TABS
10.0000 mg | ORAL_TABLET | Freq: Every day | ORAL | Status: DC
  Administered 2015-10-01 – 2015-10-02 (×2): 10 mg via ORAL
  Filled 2015-09-30 (×3): qty 1

## 2015-09-30 MED ORDER — IBUPROFEN 400 MG PO TABS
600.0000 mg | ORAL_TABLET | Freq: Once | ORAL | Status: AC
  Administered 2015-09-30: 600 mg via ORAL

## 2015-09-30 MED ORDER — FOLIC ACID 1 MG PO TABS
1.0000 mg | ORAL_TABLET | Freq: Every day | ORAL | Status: DC
  Administered 2015-09-30 – 2015-10-02 (×3): 1 mg via ORAL
  Filled 2015-09-30 (×4): qty 1

## 2015-09-30 MED ORDER — AZITHROMYCIN 250 MG PO TABS
500.0000 mg | ORAL_TABLET | ORAL | Status: DC
  Administered 2015-10-01 – 2015-10-02 (×2): 500 mg via ORAL
  Filled 2015-09-30 (×2): qty 2

## 2015-09-30 MED ORDER — IBUPROFEN 400 MG PO TABS
600.0000 mg | ORAL_TABLET | Freq: Once | ORAL | Status: DC
  Filled 2015-09-30: qty 2

## 2015-09-30 MED ORDER — INFLUENZA VAC SPLIT QUAD 0.5 ML IM SUSY
0.5000 mL | PREFILLED_SYRINGE | INTRAMUSCULAR | Status: AC
  Administered 2015-10-01: 0.5 mL via INTRAMUSCULAR
  Filled 2015-09-30: qty 0.5

## 2015-09-30 MED ORDER — IBUPROFEN 600 MG PO TABS
600.0000 mg | ORAL_TABLET | Freq: Four times a day (QID) | ORAL | Status: DC | PRN
Start: 2015-09-30 — End: 2015-10-01
  Administered 2015-10-01: 600 mg via ORAL
  Filled 2015-09-30: qty 1

## 2015-09-30 MED ORDER — LORAZEPAM 2 MG/ML IJ SOLN
1.0000 mg | Freq: Four times a day (QID) | INTRAMUSCULAR | Status: DC | PRN
  Administered 2015-10-01: 1 mg via INTRAVENOUS
  Filled 2015-09-30: qty 1

## 2015-09-30 MED ORDER — SODIUM CHLORIDE 0.9 % IV SOLN: INTRAVENOUS | Status: DC

## 2015-09-30 MED ORDER — DEXTROSE 5 % IV SOLN
1.0000 g | Freq: Once | INTRAVENOUS | Status: AC
  Administered 2015-09-30: 1 g via INTRAVENOUS
  Filled 2015-09-30: qty 10

## 2015-09-30 MED ORDER — THIAMINE HCL 100 MG/ML IJ SOLN: 100.0000 mg | Freq: Every day | INTRAMUSCULAR | Status: DC

## 2015-09-30 MED ORDER — DEXTROSE 5 % IV SOLN
1.0000 g | INTRAVENOUS | Status: DC
  Administered 2015-10-01 – 2015-10-02 (×2): 1 g via INTRAVENOUS
  Filled 2015-09-30 (×2): qty 10

## 2015-09-30 MED ORDER — AMITRIPTYLINE HCL 10 MG PO TABS
10.0000 mg | ORAL_TABLET | Freq: Every day | ORAL | Status: DC
  Administered 2015-09-30 – 2015-10-01 (×2): 10 mg via ORAL
  Filled 2015-09-30 (×2): qty 1

## 2015-09-30 MED ORDER — DEXTROSE 5 % IV SOLN
500.0000 mg | Freq: Once | INTRAVENOUS | Status: AC
  Administered 2015-09-30: 500 mg via INTRAVENOUS
  Filled 2015-09-30: qty 500

## 2015-09-30 MED ORDER — SODIUM CHLORIDE 0.9 % IV SOLN
INTRAVENOUS | Status: DC
  Administered 2015-09-30: 14:00:00 via INTRAVENOUS

## 2015-09-30 NOTE — Progress Notes (Signed)
ANTIBIOTIC CONSULT NOTE - INITIAL  Pharmacy Consult for renal dose antibiotics Indication: pneumonia  Allergies  Allergen Reactions  . Tylenol [Acetaminophen] Swelling    Patient Measurements: Height: 5' (152.4 cm) Weight: 110 lb (49.896 kg) IBW/kg (Calculated) : 45.5   Vital Signs: Temp: 101.3 F (38.5 C) (10/14 1625) BP: 150/62 mmHg (10/14 1510) Pulse Rate: 76 (10/14 1510) Intake/Output from previous day:   Intake/Output from this shift: Total I/O In: -  Out: 30 [Urine:30]  Labs:  Recent Labs  09/30/15 1447  WBC 9.7  HGB 10.8*  PLT 109*  CREATININE 0.52   Estimated Creatinine Clearance: 57.1 mL/min (by C-G formula based on Cr of 0.52). No results for input(s): VANCOTROUGH, VANCOPEAK, VANCORANDOM, GENTTROUGH, GENTPEAK, GENTRANDOM, TOBRATROUGH, TOBRAPEAK, TOBRARND, AMIKACINPEAK, AMIKACINTROU, AMIKACIN in the last 72 hours.   Microbiology: No results found for this or any previous visit (from the past 720 hour(s)).  Medical History: Past Medical History  Diagnosis Date  . CHF (congestive heart failure) (HCC)   . GERD (gastroesophageal reflux disease)   . Neuropathy (HCC)   . Anxiety   . Depression   . Liver cirrhosis (HCC)   . Chronic abdominal pain     Medications:  See medication history Assessment: 54 yo lady to start ceftriaxone and azithromycin for CAP.  Neither require adjustment for renal function.  Goal of Therapy:  Eradication of infection  Plan:  Cont ceftriaxone 1gm IV q24 hours and azithromycin 500 mg po daily Pharmacy will sign off.   Please reconsult as needed  Richell Corker Poteet 09/30/2015,5:05 PM

## 2015-09-30 NOTE — Psychosocial Assessment (Signed)
Patient given sputum cup.

## 2015-09-30 NOTE — H&P (Signed)
History and Physical  Whitney Roy WUJ:[id number] DOB: 11-23-1961 DOA: 09/30/2015  Referring physician: Dr Clarene Duke, ED physician PCP: Newman Nip, NP   Chief Complaint: Cough, altered mental status  HPI: Whitney Roy is a 54 y.o. female  With a history of diastolic CHF, GERD, liver cirrhosis secondary to alcohol use. The patient seen for worsening cough and altered mental status over the past 24 hours. The history is provided by the patient's family states the patient woke up this morning and appeared very weak and had a change in her baseline mental status - she appeared more confused than normal. Ambulation and movement made her weakness worse and her family brought her to the hospital for evaluation.  No palliating factors. Patient admits to productive cough with phlegm and complains of chest pain when she coughs and breathes. She does have some abdominal pain, particularly in the left upper and right upper quadrants.   Review of Systems:   Pt denies any fevers, chills, nausea, vomiting, diarrhea, constipation, shortness of breath, dyspnea on exertion, orthopnea, wheezing, palpitations, headache, vision changes, lightheadedness, dizziness, diarrhea, constipation, melena, rectal bleeding.  Review of systems are otherwise negative  Past Medical History  Diagnosis Date  . CHF (congestive heart failure) (HCC)   . GERD (gastroesophageal reflux disease)   . Neuropathy (HCC)   . Anxiety   . Depression   . Liver cirrhosis (HCC)   . Chronic abdominal pain    Past Surgical History  Procedure Laterality Date  . Tubal ligation     Social History:  reports that she has been smoking Cigarettes.  She does not have any smokeless tobacco history on file. She reports that she drinks alcohol. She reports that she does not use illicit drugs. Patient lives at home & is able to participate in activities of daily living with assistance  Allergies  Allergen Reactions  . Tylenol  [Acetaminophen] Swelling    Family History  Problem Relation Age of Onset  . Colon cancer Father     unsure age of diagnosis  . Liver disease Neg Hx       Prior to Admission medications   Medication Sig Start Date End Date Taking? Authorizing Provider  amitriptyline (ELAVIL) 10 MG tablet Take 10 mg by mouth at bedtime.    Historical Provider, MD  Cholecalciferol (VITAMIN D-3 PO) Take by mouth.    Historical Provider, MD  citalopram (CELEXA) 20 MG tablet Take 20 mg by mouth.    Historical Provider, MD  furosemide (LASIX) 20 MG tablet daily. 04/07/15   Historical Provider, MD  lactulose (CHRONULAC) 10 GM/15ML solution Take 15ml to 30 ml three to four times a day to have 2-3 soft bowel movements. 08/31/15   Nira Retort, NP  predniSONE (DELTASONE) 10 MG tablet Take 1 tablet (10 mg total) by mouth daily with breakfast. 08/25/15   Nira Retort, NP  ursodiol (URSO) 250 MG tablet Take 1 tablet (250 mg total) by mouth 2 (two) times daily. 08/25/15   Nira Retort, NP    Physical Exam: BP 150/62 mmHg  Pulse 76  Temp(Src) 101.3 F (38.5 C)  Resp 20  Ht 5' (1.524 m)  Wt 49.896 kg (110 lb)  BMI 21.48 kg/m2  SpO2 98%  General: Middle-age black female. Awake and alert and oriented x3. No acute cardiopulmonary distress.  Eyes: Pupils equal, round, reactive to light. Extraocular muscles are intact. Sclerae anicteric and noninjected.  ENT:  Moist mucosal membranes. No mucosal lesions. Teeth in moderate repair  Neck: Neck supple without lymphadenopathy. No carotid bruits. No masses palpated.  Cardiovascular: Regular rate with normal S1-S2 sounds. No murmurs, rubs, gallops auscultated. No JVD.  Respiratory: Good respiratory effort with no wheezes, rales, rhonchi. Lungs clear to auscultation bilaterally.  Abdomen: Soft, tender in the right upper and left upper quadrants. Mild distention.  Hypertympanitic. Active bowel sounds. No masses or hepatosplenomegaly  Skin: Dry, warm to touch. 2+ dorsalis pedis and  radial pulses. Musculoskeletal: No calf or leg pain. All major joints not erythematous nontender.  Psychiatric: Intact judgment and insight.  Neurologic: No focal neurological deficits. Cranial nerves II through XII are grossly intact.           Labs on Admission:  Basic Metabolic Panel:  Recent Labs Lab 09/30/15 1447  NA 134*  K 3.6  CL 100*  CO2 22  GLUCOSE 114*  BUN 10  CREATININE 0.52  CALCIUM 8.8*  MG 1.6*   Liver Function Tests:  Recent Labs Lab 09/30/15 1447  AST 76*  ALT 34  ALKPHOS 119  BILITOT 14.6*  PROT 8.0  ALBUMIN 3.1*   No results for input(s): LIPASE, AMYLASE in the last 168 hours.  Recent Labs Lab 09/30/15 1447  AMMONIA 40*   CBC:  Recent Labs Lab 09/30/15 1447  WBC 9.7  NEUTROABS 6.3  HGB 10.8*  HCT 31.4*  MCV 96.6  PLT 109*   Cardiac Enzymes: No results for input(s): CKTOTAL, CKMB, CKMBINDEX, TROPONINI in the last 168 hours.  BNP (last 3 results) No results for input(s): BNP in the last 8760 hours.  ProBNP (last 3 results) No results for input(s): PROBNP in the last 8760 hours.  CBG: No results for input(s): GLUCAP in the last 168 hours.  Radiological Exams on Admission: Dg Chest 2 View  09/30/2015  CLINICAL DATA:  Cough today. EXAM: CHEST - 2 VIEW COMPARISON:  CT abdomen pelvis 08/08/2015. FINDINGS: The heart is mildly enlarged, exaggerated by low lung volumes. Asymmetric left basilar airspace disease is present. There is no edema or effusion to suggest failure. The visualized soft tissues and bony thorax are unremarkable. IMPRESSION: 1. Borderline cardiomegaly without failure. 2. Asymmetric left basilar airspace disease is concerning for pneumonia. Electronically Signed   By: Marin Roberts M.D.   On: 09/30/2015 14:36   Ct Head Wo Contrast  09/30/2015  CLINICAL DATA:  Two day history of headache.  Altered mental status EXAM: CT HEAD WITHOUT CONTRAST TECHNIQUE: Contiguous axial images were obtained from the base of  the skull through the vertex without intravenous contrast. COMPARISON:  None. FINDINGS: The ventricles and sulci appear normal for age. There is no intracranial mass, hemorrhage, extra-axial fluid collection, or midline shift. The gray-white compartments appear within normal limits. No acute infarct evident. The bony calvarium appears intact. The mastoid air cells are clear. IMPRESSION: Study within normal limits. Electronically Signed   By: Bretta Bang III M.D.   On: 09/30/2015 15:44    EKG: Independently reviewed. Sinus rhythm with ventricular rate of 78.  Normal intervals. Left atrial enlargement. LVH. No ST changes.  Assessment/Plan Present on Admission:  . CAP (community acquired pneumonia) . Alcohol use (HCC) . Altered mental status  This patient was discussed with the ED physician, including pertinent vitals, physical exam findings, labs, and imaging.  We also discussed care given by the ED provider.  #1 community-acquired pneumonia #2 altered mental status #3 alcohol use #4 cirrhosis  Altered mental status likely secondary to pneumonia and alcohol use Patient's PSI score is 95,  which gives the patient almost 10% mortality rate. Patient meets admission criteria due to history of CHF, liver disease, and altered mental status. Admit to MedSurg Blood cultures drawn Continue ceftriaxone and azithromycin Strep and Legionella antigens by urine CIWA protocol Recheck CBC in the morning Sputum culture   DVT prophylaxis: INR is elevated  Consultants: None  Code Status: Full code  Family Communication: Daughter in the room   Disposition Plan: Admit   Levie HeritageJacob J Kennah Hehr, DO [facility] Pager [phone]

## 2015-09-30 NOTE — ED Provider Notes (Signed)
CSN: [id number]     Arrival date & time 09/30/15  1318 History   First MD Initiated Contact with Patient 09/30/15 1327     Chief Complaint  Patient presents with  . Altered Mental Status      HPI Pt was seen at 1350. Per pt and her family, c/o gradual onset and persistence of constant AMS and generalized weakness since last night, worse today. Pt also c/o "cough" for the past several days. Denies any change in her usual diarrhea (takes laculose daily). Denies falls, no fevers, no abd pain, no N/V, no back pain, no CP/SOB, no syncope, no focal motor weakness, no tingling/numbness in extremities.    Past Medical History  Diagnosis Date  . CHF (congestive heart failure) (HCC)   . GERD (gastroesophageal reflux disease)   . Neuropathy (HCC)   . Anxiety   . Depression   . Liver cirrhosis (HCC)   . Chronic abdominal pain    Past Surgical History  Procedure Laterality Date  . Tubal ligation     Family History  Problem Relation Age of Onset  . Colon cancer Father     unsure age of diagnosis  . Liver disease Neg Hx    Social History  Substance Use Topics  . Smoking status: Current Some Day Smoker    Types: Cigarettes  . Smokeless tobacco: None  . Alcohol Use: 0.0 oz/week    0 Standard drinks or equivalent per week     Comment: SOBER For one year. Used to drink 4-5 beers daily.     Review of Systems ROS: Statement: All systems negative except as marked or noted in the HPI; Constitutional: Negative for fever and chills. ; ; Eyes: Negative for eye pain, redness and discharge. ; ; ENMT: Negative for ear pain, hoarseness, nasal congestion, sinus pressure and sore throat. ; ; Cardiovascular: Negative for chest pain, palpitations, diaphoresis, dyspnea and peripheral edema. ; ; Respiratory: +cough. Negative for wheezing and stridor. ; ; Gastrointestinal: Negative for nausea, vomiting, diarrhea, abdominal pain, blood in stool, hematemesis, jaundice and rectal bleeding. ; ; Genitourinary:  Negative for dysuria, flank pain and hematuria. ; ; Musculoskeletal: Negative for back pain and neck pain. Negative for swelling and trauma.; ; Skin: Negative for pruritus, rash, abrasions, blisters, bruising and skin lesion.; ; Neuro: +AMS, generalized weakness. Negative for headache, lightheadedness and neck stiffness. Negative for altered level of consciousness , extremity weakness, paresthesias, involuntary movement, seizure and syncope.     Allergies  Tylenol  Home Medications   Prior to Admission medications   Medication Sig Start Date End Date Taking? Authorizing Provider  amitriptyline (ELAVIL) 10 MG tablet Take 10 mg by mouth at bedtime.    Historical Provider, MD  Cholecalciferol (VITAMIN D-3 PO) Take by mouth.    Historical Provider, MD  citalopram (CELEXA) 20 MG tablet Take 20 mg by mouth.    Historical Provider, MD  furosemide (LASIX) 20 MG tablet daily. 04/07/15   Historical Provider, MD  lactulose (CHRONULAC) 10 GM/15ML solution Take 15ml to 30 ml three to four times a day to have 2-3 soft bowel movements. 08/31/15   Nira RetortAnna W Sams, NP  predniSONE (DELTASONE) 10 MG tablet Take 1 tablet (10 mg total) by mouth daily with breakfast. 08/25/15   Nira RetortAnna W Sams, NP  ursodiol (URSO) 250 MG tablet Take 1 tablet (250 mg total) by mouth 2 (two) times daily. 08/25/15   Nira RetortAnna W Sams, NP   BP 138/60 mmHg  Pulse 75  Temp(Src)  99.3 F (37.4 C)  Resp 18  Ht 5' (1.524 m)  Wt 110 lb (49.896 kg)  BMI 21.48 kg/m2  SpO2 100%   Filed Vitals:   09/30/15 1322 09/30/15 1510 09/30/15 1625  BP: 138/60 150/62   Pulse: 75 76   Temp: 99.3 F (37.4 C) 99.5 F (37.5 C) 101.3 F (38.5 C)  Resp: Height: 5' (1.524 m)    Weight: 110 lb (49.896 kg)    SpO2: 100% 100% 98%   16:19:34 Orthostatic Vital Signs TV  Orthostatic Lying  - BP- Lying: 157/83 mmHg ; Pulse- Lying: 85  Orthostatic Sitting - BP- Sitting: 158/77 mmHg ; Pulse- Sitting: 87  Orthostatic Standing at 0 minutes - BP- Standing at 0  minutes:  (pt states she feels too weak to stand)      Physical Exam  1355: Physical examination:  Nursing notes reviewed; Vital signs and O2 SAT reviewed;  Constitutional: Well developed, Well nourished, In no acute distress; Head:  Normocephalic, atraumatic; Eyes: EOMI, PERRL, +scleral icterus; ENMT: Mouth and pharynx normal, Mucous membranes dry; Neck: Supple, Full range of motion, No lymphadenopathy; Cardiovascular: Regular rate and rhythm, No gallop; Respiratory: Breath sounds clear & equal bilaterally, No wheezes.  Speaking full sentences with ease, Normal respiratory effort/excursion; Chest: Nontender, Movement normal; Abdomen: Soft, Nontender, Nondistended, Normal bowel sounds; Genitourinary: No CVA tenderness; Extremities: Pulses normal, No tenderness, No edema, No calf edema or asymmetry.; Neuro: AA&Ox3, Major CN grossly intact. No facial droop. Speech clear. No gross focal motor or sensory deficits in extremities.; Skin: Color normal, Warm, Dry.   ED Course  Procedures (including critical care time) Labs Review   Imaging Review  I have personally reviewed and evaluated these images and lab results as part of my medical decision-making.   EKG Interpretation   Date/Time:  Friday September 30 2015 13:31:25 EDT Ventricular Rate:  78 PR Interval:  136 QRS Duration: 76 QT Interval:  446 QTC Calculation: 508 R Axis:   -5 Text Interpretation:  Normal sinus rhythm Possible Left atrial enlargement  Left ventricular hypertrophy Prolonged QT No old tracing to compare  Confirmed by [facility]  MD, Nicholos Johns [phone]) on 09/30/2015 1:38:10 PM      MDM  MDM Reviewed: previous chart, nursing note and vitals Reviewed previous: labs and ECG Interpretation: labs, ECG and x-ray      Results for orders placed or performed during the hospital encounter of 09/30/15  CBC with Differential  Result Value Ref Range   WBC 9.7 4.0 - 10.5 K/uL   RBC 3.25 (L) 3.87 - 5.11 MIL/uL   Hemoglobin 10.8  (L) 12.0 - 15.0 g/dL   HCT 60.4 (L) 54.0 - 98.1 %   MCV 96.6 78.0 - 100.0 fL   MCH 33.2 26.0 - 34.0 pg   MCHC 34.4 30.0 - 36.0 g/dL   RDW 19.1 (H) 47.8 - 29.5 %   Platelets 109 (L) 150 - 400 K/uL   Neutrophils Relative % 64 %   Neutro Abs 6.3 1.7 - 7.7 K/uL   Lymphocytes Relative 18 %   Lymphs Abs 1.7 0.7 - 4.0 K/uL   Monocytes Relative 15 %   Monocytes Absolute 1.5 (H) 0.1 - 1.0 K/uL   Eosinophils Relative 1 %   Eosinophils Absolute 0.1 0.0 - 0.7 K/uL   Basophils Relative 2 %   Basophils Absolute 0.2 (H) 0.0 - 0.1 K/uL  Comprehensive metabolic panel  Result Value Ref Range   Sodium 134 (L) 135 -  145 mmol/L   Potassium 3.6 3.5 - 5.1 mmol/L   Chloride 100 (L) 101 - 111 mmol/L   CO2 22 22 - 32 mmol/L   Glucose, Bld 114 (H) 65 - 99 mg/dL   BUN 10 6 - 20 mg/dL   Creatinine, Ser 1.61 0.44 - 1.00 mg/dL   Calcium 8.8 (L) 8.9 - 10.3 mg/dL   Total Protein 8.0 6.5 - 8.1 g/dL   Albumin 3.1 (L) 3.5 - 5.0 g/dL   AST 76 (H) 15 - 41 U/L   ALT 34 14 - 54 U/L   Alkaline Phosphatase 119 38 - 126 U/L   Total Bilirubin 14.6 (H) 0.3 - 1.2 mg/dL   GFR calc non Af Amer >60 >60 mL/min   GFR calc Af Amer >60 >60 mL/min   Anion gap 12 5 - 15  Ammonia  Result Value Ref Range   Ammonia 40 (H) 9 - 35 umol/L  Ethanol  Result Value Ref Range   Alcohol, Ethyl (B) 47 (H) <5 mg/dL  Urine rapid drug screen (hosp performed)  Result Value Ref Range   Opiates NONE DETECTED NONE DETECTED   Cocaine NONE DETECTED NONE DETECTED   Benzodiazepines POSITIVE (A) NONE DETECTED   Amphetamines NONE DETECTED NONE DETECTED   Tetrahydrocannabinol NONE DETECTED NONE DETECTED   Barbiturates NONE DETECTED NONE DETECTED  Magnesium  Result Value Ref Range   Magnesium 1.6 (L) 1.7 - 2.4 mg/dL  Protime-INR  Result Value Ref Range   Prothrombin Time 24.3 (H) 11.6 - 15.2 seconds   INR 2.21 (H) 0.00 - 1.49   Dg Chest 2 View 09/30/2015  CLINICAL DATA:  Cough today. EXAM: CHEST - 2 VIEW COMPARISON:  CT abdomen pelvis  08/08/2015. FINDINGS: The heart is mildly enlarged, exaggerated by low lung volumes. Asymmetric left basilar airspace disease is present. There is no edema or effusion to suggest failure. The visualized soft tissues and bony thorax are unremarkable. IMPRESSION: 1. Borderline cardiomegaly without failure. 2. Asymmetric left basilar airspace disease is concerning for pneumonia. Electronically Signed   By: Marin Roberts M.D.   On: 09/30/2015 14:36   Ct Head Wo Contrast 09/30/2015  CLINICAL DATA:  Two day history of headache.  Altered mental status EXAM: CT HEAD WITHOUT CONTRAST TECHNIQUE: Contiguous axial images were obtained from the base of the skull through the vertex without intravenous contrast. COMPARISON:  None. FINDINGS: The ventricles and sulci appear normal for age. There is no intracranial mass, hemorrhage, extra-axial fluid collection, or midline shift. The gray-white compartments appear within normal limits. No acute infarct evident. The bony calvarium appears intact. The mastoid air cells are clear. IMPRESSION: Study within normal limits. Electronically Signed   By: Bretta Bang III M.D.   On: 09/30/2015 15:44    1615:  Pt now with fever; BC x2 obtained. IV abx for CAP started. H/H and platelets per baseline. Pt's etoh level elevated, though states she had "stopped drinking." No signs of withdrawal at this time. Unable to stand for orthostatic VS due to generalized weakness. PSI/PORT score 100; will admit.  Dx and testing d/w pt and family.  Questions answered.  Verb understanding, agreeable to admit.   T/C to [facility]. Adrian Blackwater, case discussed, including:  HPI, pertinent PM/SHx, VS/PE, dx testing, ED course and treatment:  Agreeable to admit, requests to write temporary orders, obtain observation medical bed to team APAdmits.    Samuel Jester, DO 10/03/15 0007

## 2015-09-30 NOTE — ED Notes (Signed)
Pt's daughter states that patient has been having a mental decline with increased jaundice more noticeable today.

## 2015-10-01 DIAGNOSIS — K7030 Alcoholic cirrhosis of liver without ascites: Secondary | ICD-10-CM

## 2015-10-01 DIAGNOSIS — G9340 Encephalopathy, unspecified: Secondary | ICD-10-CM | POA: Diagnosis not present

## 2015-10-01 DIAGNOSIS — R531 Weakness: Secondary | ICD-10-CM | POA: Diagnosis not present

## 2015-10-01 DIAGNOSIS — J189 Pneumonia, unspecified organism: Secondary | ICD-10-CM | POA: Diagnosis not present

## 2015-10-01 LAB — STREP PNEUMONIAE URINARY ANTIGEN: Strep Pneumo Urinary Antigen: NEGATIVE

## 2015-10-01 LAB — CBC
HCT: 28.7 % — ABNORMAL LOW (ref 36.0–46.0)
Hemoglobin: 9.7 g/dL — ABNORMAL LOW (ref 12.0–15.0)
MCH: 32.6 pg (ref 26.0–34.0)
MCHC: 33.8 g/dL (ref 30.0–36.0)
MCV: 96.3 fL (ref 78.0–100.0)
PLATELETS: 100 10*3/uL — AB (ref 150–400)
RBC: 2.98 MIL/uL — ABNORMAL LOW (ref 3.87–5.11)
RDW: 17.9 % — ABNORMAL HIGH (ref 11.5–15.5)
WBC: 7.5 10*3/uL (ref 4.0–10.5)

## 2015-10-01 MED ORDER — NICOTINE 21 MG/24HR TD PT24
21.0000 mg | MEDICATED_PATCH | Freq: Every day | TRANSDERMAL | Status: DC
  Administered 2015-10-01 – 2015-10-02 (×2): 21 mg via TRANSDERMAL
  Filled 2015-10-01 (×2): qty 1

## 2015-10-01 MED ORDER — URSODIOL 300 MG PO CAPS
300.0000 mg | ORAL_CAPSULE | Freq: Two times a day (BID) | ORAL | Status: DC
  Administered 2015-10-01 – 2015-10-02 (×3): 300 mg via ORAL
  Filled 2015-10-01 (×5): qty 1

## 2015-10-01 NOTE — Progress Notes (Addendum)
Pt has been resting all night with no complaints of discomfort, daughter at bedside.  No sputum nor urine collected so far during this shift.  O2 =  98-100%

## 2015-10-01 NOTE — Progress Notes (Addendum)
PROGRESS NOTE  Whitney Sailsngela Ransford WUJ:[id number] DOB: 12/08/1961 DOA: 09/30/2015 PCP: Newman NipLAYTON, MONICA, NP  Summary: [age] yow PMH alcoholic liver cirrhosis presented with a worsening cough and AMS.complained of productive cough. While in the ED, labs revealed a mildly elevated WBC count but were otherwise unremarkable. CXR concerning for new left sided PNA.  Admitted for further management.   Assessment/Plan: 1. CAP with fever but no hypoxia, seen on CXR 10/14. WBC wnl. Fever resolved. BC pending 2. Acute encephalopathy, likely due to acute infection and alcohol abuse. Appears resolved.   3. 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 CIWA protocol.  4. Alcoholic cirrhosis with severe hyperbilirubinemia, thrombocytopenia, coagulopathy, anemia, secondary to alcohol abuse. Tbili 14.6.  Scheduled for an appointment on 11/2 for consultation for liver transplant.  5. Anxiety, depression, appears stable    Overall improved, continue abx. Hoping for discharge 10/16  Code Status: Full DVT prophylaxis: SCDs Family Communication: Niece and family friend at bedside. Discussed with patient who understands and has no concerns at this time. Disposition Plan: Discharge within 24 hours.   Brendia Sacksaniel Angelica Wix, MD  [facility]  Pager [phone] If 7PM-7AM, please contact night-coverage at [email or web address], password [email or web address] 10/01/2015, 7:04 AM    Consultants:    Procedures:    Antibiotics:  Rocephin 10/15>>  Zithromax 10/15>>  HPI/Subjective: Feels better. Has been able to eat without any nausea or vomiting. Denies any SOB but has a mild headache.   Objective: Filed Vitals:   09/30/15 1802 09/30/15 1904 09/30/15 1907 10/01/15 0621  BP: 154/73 127/56  113/42  Pulse: 81 80  71  Temp: 101 F (38.3 C) 99.4 F (37.4 C)  98.4 F (36.9 C)  TempSrc:  Oral  Oral  Resp: 18 18  14   Height:  5' (1.524 m)    Weight:  52.9 kg (116 lb 10 oz)    SpO2: 99% 98%  98% 100%    Intake/Output Summary (Last 24 hours) at 10/01/15 0704 Last data filed at 09/30/15 1549  Gross per 24 hour  Intake      0 ml  Output     30 ml  Net    -30 ml     Filed Weights   09/30/15 1322 09/30/15 1904  Weight: 49.896 kg (110 lb) 52.9 kg (116 lb 10 oz)    Exam:    VSS. Afebrile, not hypoxic General:  Appears calm and comfortable Cardiovascular: 2/6 systolic murmur LUSB, RRR, no r/g. No LE edema. Respiratory: CTA bilaterally, no w/r/r. Normal respiratory effort. Abdomen: soft, nt, distended Musculoskeletal: grossly normal tone BUE/BLE Psychiatric: grossly normal mood and affect, speech fluent and appropriate Neurologic: grossly non-focal.  New data reviewed:  WBC normal, hgb 9.7  Pertinent data since admission:  t bili 12.8  EKG SR  CXR  IMPRESSION: 1. Borderline cardiomegaly without failure. 2. Asymmetric left basilar airspace disease is concerning for Pneumonia.  Head CT negative  Pending data:  BC  Scheduled Meds: . sodium chloride   Intravenous STAT  . amitriptyline  10 mg Oral QHS  . azithromycin  500 mg Oral Q24H  . cefTRIAXone (ROCEPHIN)  IV  1 g Intravenous Q24H  . citalopram  20 mg Oral Daily  . folic acid  1 mg Oral Daily  . furosemide  20 mg Oral Daily  . Influenza vac split quadrivalent PF  0.5 mL Intramuscular Tomorrow-1000  . lactulose  10 g Oral TID  . multivitamin with minerals  1 tablet Oral  Daily  . pneumococcal 23 valent vaccine  0.5 mL Intramuscular Tomorrow-1000  . predniSONE  10 mg Oral Q breakfast  . thiamine  100 mg Oral Daily   Or  . thiamine  100 mg Intravenous Daily  . ursodiol  250 mg Oral BID   Continuous Infusions: . sodium chloride 100 mL/hr at 09/30/15 1405    Principal Problem:   CAP (community acquired pneumonia) Active Problems:   Cirrhosis, alcoholic (HCC)   Acute encephalopathy   Time spent 25 minutes   By signing my name below, I, Burnett Harry attest that this documentation has  been prepared under the direction and in the presence of [name], MD Electronically signed: Burnett Harry, Scribe.  10/01/2015 12:45pm  I personally performed the services described in this documentation. All medical record entries made by the scribe were at my direction. I have reviewed the chart and agree that the record reflects my personal performance and is accurate and complete. [name], MD

## 2015-10-02 DIAGNOSIS — J159 Unspecified bacterial pneumonia: Secondary | ICD-10-CM | POA: Diagnosis not present

## 2015-10-02 DIAGNOSIS — R531 Weakness: Secondary | ICD-10-CM | POA: Diagnosis not present

## 2015-10-02 DIAGNOSIS — K7030 Alcoholic cirrhosis of liver without ascites: Secondary | ICD-10-CM | POA: Diagnosis not present

## 2015-10-02 DIAGNOSIS — I509 Heart failure, unspecified: Secondary | ICD-10-CM | POA: Diagnosis not present

## 2015-10-02 DIAGNOSIS — J189 Pneumonia, unspecified organism: Secondary | ICD-10-CM | POA: Diagnosis not present

## 2015-10-02 DIAGNOSIS — F1010 Alcohol abuse, uncomplicated: Secondary | ICD-10-CM | POA: Diagnosis not present

## 2015-10-02 LAB — HIV ANTIBODY (ROUTINE TESTING W REFLEX): HIV SCREEN 4TH GENERATION: NONREACTIVE

## 2015-10-02 MED ORDER — OXYCODONE HCL 5 MG PO TABS
5.0000 mg | ORAL_TABLET | Freq: Once | ORAL | Status: AC
  Administered 2015-10-02: 5 mg via ORAL
  Filled 2015-10-02: qty 1

## 2015-10-02 MED ORDER — LACTULOSE 10 GM/15ML PO SOLN
30.0000 g | ORAL | Status: DC
Start: 2015-10-02 — End: 2015-10-02

## 2015-10-02 MED ORDER — ADULT MULTIVITAMIN W/MINERALS CH: 1.0000 | ORAL_TABLET | Freq: Every day | ORAL | Status: DC

## 2015-10-02 MED ORDER — AZITHROMYCIN 250 MG PO TABS: 250.0000 mg | ORAL_TABLET | Freq: Every day | ORAL | Status: DC

## 2015-10-02 MED ORDER — LACTULOSE 10 GM/15ML PO SOLN
30.0000 g | ORAL | Status: DC
Start: 2015-10-02 — End: 2015-10-02
  Administered 2015-10-02 (×2): 30 g via ORAL
  Filled 2015-10-02 (×2): qty 60

## 2015-10-02 MED ORDER — FOLIC ACID 1 MG PO TABS: 1.0000 mg | ORAL_TABLET | Freq: Every day | ORAL | Status: AC

## 2015-10-02 MED ORDER — CEFUROXIME AXETIL 250 MG PO TABS: 500.0000 mg | ORAL_TABLET | Freq: Two times a day (BID) | ORAL | Status: DC

## 2015-10-02 MED ORDER — THIAMINE HCL 100 MG PO TABS: 100.0000 mg | ORAL_TABLET | Freq: Every day | ORAL | Status: AC

## 2015-10-02 MED ORDER — NICOTINE 21 MG/24HR TD PT24: 21.0000 mg | MEDICATED_PATCH | Freq: Every day | TRANSDERMAL | Status: AC

## 2015-10-02 MED ORDER — CEFUROXIME AXETIL 500 MG PO TABS: 500.0000 mg | ORAL_TABLET | Freq: Two times a day (BID) | ORAL | Status: DC

## 2015-10-02 NOTE — Progress Notes (Signed)
PROGRESS NOTE  Whitney Roy WUJ:[id number] DOB: November 11, 1961 DOA: 09/30/2015 PCP: Newman Nip, NP  Summary: 28 yow PMH alcoholic liver cirrhosis presented with a worsening cough and AMS.complained of productive cough. While in the ED, labs revealed a mildly elevated WBC count but were otherwise unremarkable. CXR concerning for new left sided PNA.  Admitted for further management.   Assessment/Plan: 1. CAP with fever but no hypoxia, seen on CXR 10/14. Improved. WBC wnl. Fever resolved. BC show no growth to date, final results pending.  2. Acute encephalopathy, likely due to acute infection and alcohol abuse. Resolved.   3. PMH alcohol abuse, last drink reported 10/10 with elevated alcohol level on admission. CIWA protocol. No evidence of withdrawal. 4. Alcoholic cirrhosis with severe hyperbilirubinemia, thrombocytopenia, coagulopathy, anemia, secondary to alcohol abuse.  Scheduled for an appointment on 11/2 for consultation for liver transplant.  5. Anxiety, depression, appears stable    Overall improved, continue abx.  Anticipate discharge home next 24 hours  Code Status: Full DVT prophylaxis: SCDs Family Communication: No family at bedside. Discussed with patient who understands and has no concerns at this time. Disposition Plan: Discharge home today.   [name], MD  [facility]  Pager [phone] If 7PM-7AM, please contact night-coverage at [email or web address], password [facility] 10/02/2015, 6:53 AM    Consultants:    Procedures:    Antibiotics:  Rocephin 10/15>> 10/16  Ceftin 10/16>>10/21  Zithromax 10/15>> 10/19  HPI/Subjective: Feels well. Has a mild HA but denies any abdominal pain, nausea, or vomiting. Cough and SOB is improved. Able to ambulate without difficulty.   Objective: Filed Vitals:   10/01/15 0621 10/01/15 1529 10/01/15 2235 10/02/15 0641  BP: 113/42 142/55 138/60 135/68  Pulse: 71 78 88 83  Temp: 98.4 F (36.9 C) 98.2 F (36.8 C) 98.3 F  (36.8 C) 98.3 F (36.8 C)  TempSrc: Oral Oral Oral Oral  Resp: Height:      Weight:      SpO2: 100% 94% 100% 100%    Intake/Output Summary (Last 24 hours) at 10/02/15 0653 Last data filed at 10/01/15 1800  Gross per 24 hour  Intake    840 ml  Output      0 ml  Net    840 ml     Filed Weights   09/30/15 1322 09/30/15 1904  Weight: 49.896 kg (110 lb) 52.9 kg (116 lb 10 oz)    Exam:    VSS. Afebrile, not hypoxic General:  Appears comfortable, calm. Eyes: PERRL,  scleral icterus ENT: grossly normal hearing, lips, tongue Neck: no LAD, masses, thyromegaly Cardiovascular: Regular rate and rhythm, no murmur, rub or gallop. No lower extremity edema. Respiratory: Clear to auscultation bilaterally, no wheezes, rales or rhonchi. Normal respiratory effort. Abdomen: soft, ntnd Skin: no rash or induration  Musculoskeletal: grossly normal tone bilateral upper and lower extremities Psychiatric: grossly normal mood and affect, speech fluent and appropriate. Oriented to person, place, and president.  Neurologic: grossly non-focal.  New data reviewed:  EKG - SR, LVH, prolonged QT. No significant change from prior   Pertinent data since admission:  t bili 12.8  EKG SR  CXR  IMPRESSION: 1. Borderline cardiomegaly without failure. 2. Asymmetric left basilar airspace disease is concerning for Pneumonia.  Head CT negative  Pending data:  BC  Scheduled Meds: . amitriptyline  10 mg Oral QHS  . azithromycin  500 mg Oral Q24H  . cefTRIAXone (ROCEPHIN)  IV  1 g Intravenous Q24H  . citalopram  20 mg Oral Daily  . folic acid  1 mg Oral Daily  . furosemide  20 mg Oral Daily  . lactulose  10 g Oral TID  . multivitamin with minerals  1 tablet Oral Daily  . nicotine  21 mg Transdermal Daily  . predniSONE  10 mg Oral Q breakfast  . thiamine  100 mg Oral Daily   Or  . thiamine  100 mg Intravenous Daily  . ursodiol  300 mg Oral BID   Continuous Infusions:     Principal Problem:   CAP (community acquired pneumonia) Active Problems:   Cirrhosis, alcoholic (HCC)   Acute encephalopathy    By signing my name below, I, Burnett HarryJennifer Gregorio attest that this documentation has been prepared under the direction and in the presence of Brendia Sacksaniel Goodrich, MD Electronically signed: Burnett HarryJennifer Gregorio, Scribe.  10/02/2015 9:20am  I personally performed the services described in this documentation. All medical record entries made by the scribe were at my direction. I have reviewed the chart and agree that the record reflects my personal performance and is accurate and complete. Brendia Sacksaniel Goodrich, MD

## 2015-10-02 NOTE — Progress Notes (Signed)
Pt resting well.  No sputum collected.

## 2015-10-02 NOTE — Progress Notes (Signed)
Pt's IV catheter removed and intact. Pt's IV site clean dry and intact. Discharge instructions, medications and follow up appointments reviewed and discussed with patient and her significant other. Pt and significant other verbalized understanding of discharge instructions. All questions were answered and no further questions at this time. Pt in stable condition and in no acute distress at time of discharge. Pt escorted by RN.

## 2015-10-02 NOTE — Discharge Summary (Signed)
Physician Discharge Summary  Whitney Roy RUE:[id number] DOB: [birth year] DOA: 09/30/2015  PCP: Newman Nip, NP  Admit date: 09/30/2015 Discharge date: 10/02/2015  Recommendations for Outpatient Follow-up:  1. Follow up with PCP in 1-2 weeks for resolution of PNA.   Follow-up Information    Follow up with CLAYTON, MONICA, NP. Schedule an appointment as soon as possible for a visit in 1 week.   Specialty:  Nurse Practitioner   Contact information:   [address] [place] [address] [phone]       Discharge Diagnoses:  1. CAP   2. Acute encephalopathy, likely due to acute infection and alcohol abuse. 3. Alcohol use 4. Alcoholic cirrhosis with severe hyperbilirubinemia, thrombocytopenia, coagulopathy, anemia,   5. Anxiety, depression.  Discharge Condition: Improved Disposition: Home  Diet recommendation: Regular  Filed Weights   09/30/15 1322 09/30/15 1904  Weight: 49.896 kg (110 lb) 52.9 kg (116 lb 10 oz)    History of present illness:  71 yow PMH alcoholic liver cirrhosis presented with a worsening cough and AMS.complained of productive cough. While in the ED, labs revealed a mildly elevated WBC count but were otherwise unremarkable. CXR concerning for new left sided PNA. Admitted for further management.   Hospital Course:  CAP clinically resolved with antibiotics. Remains afebrile and WBC WNL. No hypoxia.Acute encephalopathy, likely due to acute infection and alcohol abuse resolved with treatment. CIWA protocols were in place but patient did not exhibit any signs of withdrawal. Alcohol cirrhosis with severe hyperbilirubinemia, thrombocytopenia, coagulopathy,and anemia, secondary to alcohol abuse remained stable. Patient has an appointment scheduled for 11/2 for consultation for a liver transplant.   Individual issues as below:  1. CAP with fever but no hypoxia, seen on CXR 10/14. Appears resolved. WBC wnl. Fever resolved. BC show no growth to date, final  results pending.  2. Acute encephalopathy, likely due to acute infection and alcohol abuse. Resolved.  3. PMH alcohol abuse, last drink reported 10/10 with elevated alcohol level on admission. CIWA protocol. No evidence of withdrawal. 4. Alcoholic cirrhosis with severe hyperbilirubinemia, thrombocytopenia, coagulopathy, anemia, secondary to alcohol abuse. Scheduled for an appointment on 11/2 for consultation for liver transplant.  5. Anxiety, depression, appears stable  Consultants:  none  Procedures:  none  Antibiotics:  Rocephin 10/15>> 10/16  Ceftin 10/16>>10/21  Zithromax 10/15>> 10/19  Discharge Instructions Discharge Instructions    Activity as tolerated - No restrictions    Complete by:  As directed      Diet - low sodium heart healthy    Complete by:  As directed      Discharge instructions    Complete by:  As directed   Call your physician or seek immediate medical attention for fever, shortness of breath, confusion or worsening of condition.            Discharge Medication List as of 10/02/2015  4:27 PM    START taking these medications   Details  azithromycin (ZITHROMAX) 250 MG tablet Take 1 tablet (250 mg total) by mouth daily. Start 10/17., Starting 10/02/2015, Until Discontinued, Normal    cefUROXime (CEFTIN) 500 MG tablet Take 1 tablet (500 mg total) by mouth 2 (two) times daily with a meal. Start 10/17., Starting 10/03/2015, Until Discontinued, Normal    folic acid (FOLVITE) 1 MG tablet Take 1 tablet (1 mg total) by mouth daily., Starting 10/02/2015, Until Discontinued, No Print    Multiple Vitamin (MULTIVITAMIN WITH MINERALS) TABS tablet Take 1 tablet by mouth daily., Starting 10/02/2015, Until  Discontinued, No Print    nicotine (NICODERM CQ - DOSED IN MG/24 HOURS) 21 mg/24hr patch Place 1 patch (21 mg total) onto the skin daily., Starting 10/02/2015, Until Discontinued, Normal    thiamine 100 MG tablet Take 1 tablet (100 mg total) by mouth  daily., Starting 10/02/2015, Until Discontinued, No Print      CONTINUE these medications which have NOT CHANGED   Details  amitriptyline (ELAVIL) 10 MG tablet Take 10 mg by mouth at bedtime., Until Discontinued, Historical Med    citalopram (CELEXA) 40 MG tablet Take 40 mg by mouth daily., Until Discontinued, Historical Med    lactulose (CHRONULAC) 10 GM/15ML solution Take 15ml to 30 ml three to four times a day to have 2-3 soft bowel movements., Normal    predniSONE (DELTASONE) 10 MG tablet Take 1 tablet (10 mg total) by mouth daily with breakfast., Starting 08/25/2015, Until Discontinued, Normal    ursodiol (URSO) 250 MG tablet Take 1 tablet (250 mg total) by mouth 2 (two) times daily., Starting 08/25/2015, Until Discontinued, Print      STOP taking these medications     furosemide (LASIX) 20 MG tablet        Allergies  Allergen Reactions  . Tylenol [Acetaminophen] Swelling    The results of significant diagnostics from this hospitalization (including imaging, microbiology, ancillary and laboratory) are listed below for reference.    Significant Diagnostic Studies: Dg Chest 2 View  09/30/2015  CLINICAL DATA:  Cough today. EXAM: CHEST - 2 VIEW COMPARISON:  CT abdomen pelvis 08/08/2015. FINDINGS: The heart is mildly enlarged, exaggerated by low lung volumes. Asymmetric left basilar airspace disease is present. There is no edema or effusion to suggest failure. The visualized soft tissues and bony thorax are unremarkable. IMPRESSION: 1. Borderline cardiomegaly without failure. 2. Asymmetric left basilar airspace disease is concerning for pneumonia. Electronically Signed   By: Marin Robertshristopher  Mattern M.D.   On: 09/30/2015 14:36   Ct Head Wo Contrast  09/30/2015  CLINICAL DATA:  Two day history of headache.  Altered mental status EXAM: CT HEAD WITHOUT CONTRAST TECHNIQUE: Contiguous axial images were obtained from the base of the skull through the vertex without intravenous contrast.  COMPARISON:  None. FINDINGS: The ventricles and sulci appear normal for age. There is no intracranial mass, hemorrhage, extra-axial fluid collection, or midline shift. The gray-white compartments appear within normal limits. No acute infarct evident. The bony calvarium appears intact. The mastoid air cells are clear. IMPRESSION: Study within normal limits. Electronically Signed   By: Bretta BangWilliam  Woodruff III M.D.   On: 09/30/2015 15:44   Microbiology: Recent Results (from the past 240 hour(s))  Blood culture (routine x 2)     Status: None (Preliminary result)   Collection Time: 09/30/15  4:47 PM  Result Value Ref Range Status   Specimen Description LEFT ANTECUBITAL  Final   Special Requests BOTTLES DRAWN AEROBIC AND ANAEROBIC 6CC  Final   Culture NO GROWTH 2 DAYS  Final   Report Status PENDING  Incomplete  Blood culture (routine x 2)     Status: None (Preliminary result)   Collection Time: 09/30/15  4:59 PM  Result Value Ref Range Status   Specimen Description LEFT ANTECUBITAL  Final   Special Requests BOTTLES DRAWN AEROBIC ONLY 6CC  Final   Culture NO GROWTH 2 DAYS  Final   Report Status PENDING  Incomplete     Labs: Basic Metabolic Panel:  Recent Labs Lab 09/30/15 1447  NA 134*  K 3.6  CL 100*  CO2 22  GLUCOSE 114*  BUN 10  CREATININE 0.52  CALCIUM 8.8*  MG 1.6*   Liver Function Tests:  Recent Labs Lab 09/30/15 1447  AST 76*  ALT 34  ALKPHOS 119  BILITOT 14.6*  PROT 8.0  ALBUMIN 3.1*    Recent Labs Lab 09/30/15 1447  AMMONIA 40*   CBC:  Recent Labs Lab 09/30/15 1447 10/01/15 0643  WBC 9.7 7.5  NEUTROABS 6.3  --   HGB 10.8* 9.7*  HCT 31.4* 28.7*  MCV 96.6 96.3  PLT 109* 100*    Principal Problem:   CAP (community acquired pneumonia) Active Problems:   Cirrhosis, alcoholic (HCC)   Acute encephalopathy   Time coordinating discharge: 35 minutes  Signed:  [name], MD [facility] 10/02/2015, 9:22 AM   By signing my name  below, I, Burnett Harry attest that this documentation has been prepared under the direction and in the presence of [name], MD Electronically signed: Burnett Harry, Scribe.  10/02/2015  I personally performed the services described in this documentation. All medical record entries made by the scribe were at my direction. I have reviewed the chart and agree that the record reflects my personal performance and is accurate and complete. [name], MD

## 2015-10-03 LAB — LEGIONELLA PNEUMOPHILA SEROGP 1 UR AG: L. PNEUMOPHILA SEROGP 1 UR AG: NEGATIVE

## 2015-10-05 ENCOUNTER — Telehealth: Payer: Self-pay | Admitting: Gastroenterology

## 2015-10-05 LAB — CULTURE, BLOOD (ROUTINE X 2)
CULTURE: NO GROWTH
Culture: NO GROWTH

## 2015-10-05 MED ORDER — URSODIOL 300 MG PO CAPS: 300.0000 mg | ORAL_CAPSULE | Freq: Two times a day (BID) | ORAL | Status: DC

## 2015-10-05 NOTE — Telephone Encounter (Signed)
Kris MoutonBobby Garland (friend of patient) called to say that the pharmacy will be faxing us a over ride on her prescription of Urso and he would like a call back to let them know it's been done. [phone] or [phone]

## 2015-10-05 NOTE — Telephone Encounter (Signed)
Checked [place] medicaid formulary- they will only pay for capsules and not the tablets. I called [facility], spoke with the pharmacist, they only have ursodiol capsules in 300mg , the other strengths are tablets. I spoke with Dr.Fields, she said it was ok for the pt to take 300mg  bid. New rx sent to the pharmacy. [facility] to [facility] inform pt [facility]

## 2015-10-10 ENCOUNTER — Ambulatory Visit (INDEPENDENT_AMBULATORY_CARE_PROVIDER_SITE_OTHER): Payer: Medicaid Other | Admitting: Gastroenterology

## 2015-10-10 ENCOUNTER — Encounter: Payer: Self-pay | Admitting: Gastroenterology

## 2015-10-10 ENCOUNTER — Other Ambulatory Visit: Payer: Self-pay

## 2015-10-10 VITALS — BP 139/71 | HR 97 | Temp 97.8°F | Ht 60.0 in | Wt 121.8 lb

## 2015-10-10 DIAGNOSIS — R079 Chest pain, unspecified: Secondary | ICD-10-CM

## 2015-10-10 DIAGNOSIS — G9340 Encephalopathy, unspecified: Secondary | ICD-10-CM

## 2015-10-10 DIAGNOSIS — K7030 Alcoholic cirrhosis of liver without ascites: Secondary | ICD-10-CM | POA: Diagnosis not present

## 2015-10-10 DIAGNOSIS — M545 Low back pain: Secondary | ICD-10-CM

## 2015-10-10 DIAGNOSIS — R1310 Dysphagia, unspecified: Secondary | ICD-10-CM

## 2015-10-10 NOTE — Progress Notes (Signed)
cc'ed to pcp °

## 2015-10-10 NOTE — Patient Instructions (Addendum)
Continue Urso twice a day, prednisone once daily, and increase lactulose to 3-4 times a day in order to have 3 soft bowel movements daily.   I would like to see you in 4 weeks.   Absolutely stay away from any and all kinds of alcohol.  Please review the low sodium diet. You may only have 2 grams a day.   Low-Sodium Eating Plan Sodium raises blood pressure and causes water to be held in the body. Getting less sodium from food will help lower your blood pressure, reduce any swelling, and protect your heart, liver, and kidneys. We get sodium by adding salt (sodium chloride) to food. Most of our sodium comes from canned, boxed, and frozen foods. Restaurant foods, fast foods, and pizza are also very high in sodium. Even if you take medicine to lower your blood pressure or to reduce fluid in your body, getting less sodium from your food is important. WHAT IS MY PLAN? Most people should limit their sodium intake to 2,300 mg a day. Your health care provider recommends that you limit your sodium intake to 2 grams a day.  WHAT DO I NEED TO KNOW ABOUT THIS EATING PLAN? For the low-sodium eating plan, you will follow these general guidelines:  Choose foods with a % Daily Value for sodium of less than 5% (as listed on the food label).   Use salt-free seasonings or herbs instead of table salt or sea salt.   Check with your health care provider or pharmacist before using salt substitutes.   Eat fresh foods.  Eat more vegetables and fruits.  Limit canned vegetables. If you do use them, rinse them well to decrease the sodium.   Limit cheese to 1 oz (28 g) per day.   Eat lower-sodium products, often labeled as "lower sodium" or "no salt added."  Avoid foods that contain monosodium glutamate (MSG). MSG is sometimes added to [place] food and some canned foods.  Check food labels (Nutrition Facts labels) on foods to learn how much sodium is in one serving.  Eat more home-cooked food and less  restaurant, buffet, and fast food.  When eating at a restaurant, ask that your food be prepared with less salt, or no salt if possible.  HOW DO I READ FOOD LABELS FOR SODIUM INFORMATION? The Nutrition Facts label lists the amount of sodium in one serving of the food. If you eat more than one serving, you must multiply the listed amount of sodium by the number of servings. Food labels may also identify foods as:  Sodium free--Less than 5 mg in a serving.  Very low sodium--35 mg or less in a serving.  Low sodium--140 mg or less in a serving.  Light in sodium--50% less sodium in a serving. For example, if a food that usually has 300 mg of sodium is changed to become light in sodium, it will have 150 mg of sodium.  Reduced sodium--25% less sodium in a serving. For example, if a food that usually has 400 mg of sodium is changed to reduced sodium, it will have 300 mg of sodium. WHAT FOODS CAN I EAT? Grains Low-sodium cereals, including oats, puffed wheat and rice, and shredded wheat cereals. Low-sodium crackers. Unsalted rice and pasta. Lower-sodium bread.  Vegetables Frozen or fresh vegetables. Low-sodium or reduced-sodium canned vegetables. Low-sodium or reduced-sodium tomato sauce and paste. Low-sodium or reduced-sodium tomato and vegetable juices.  Fruits Fresh, frozen, and canned fruit. Fruit juice.  Meat and Other Protein Products Low-sodium canned tuna  and salmon. Fresh or frozen meat, poultry, seafood, and fish. Lamb. Unsalted nuts. Dried beans, peas, and lentils without added salt. Unsalted canned beans. Homemade soups without salt. Eggs.  Dairy Milk. Soy milk. Ricotta cheese. Low-sodium or reduced-sodium cheeses. Yogurt.  Condiments Fresh and dried herbs and spices. Salt-free seasonings. Onion and garlic powders. Low-sodium varieties of mustard and ketchup. Fresh or refrigerated horseradish. Lemon juice.  Fats and Oils Reduced-sodium salad dressings. Unsalted butter.   Other Unsalted popcorn and pretzels.  The items listed above may not be a complete list of recommended foods or beverages. Contact your dietitian for more options. WHAT FOODS ARE NOT RECOMMENDED? Grains Instant hot cereals. Bread stuffing, pancake, and biscuit mixes. Croutons. Seasoned rice or pasta mixes. Noodle soup cups. Boxed or frozen macaroni and cheese. Self-rising flour. Regular salted crackers. Vegetables Regular canned vegetables. Regular canned tomato sauce and paste. Regular tomato and vegetable juices. Frozen vegetables in sauces. Salted [place]French fries. Olives. Rosita FirePickles. Relishes. Sauerkraut. Salsa. Meat and Other Protein Products Salted, canned, smoked, spiced, or pickled meats, seafood, or fish. Bacon, ham, sausage, hot dogs, corned beef, chipped beef, and packaged luncheon meats. Salt pork. Jerky. Pickled herring. Anchovies, regular canned tuna, and sardines. Salted nuts. Dairy Processed cheese and cheese spreads. Cheese curds. Blue cheese and cottage cheese. Buttermilk.  Condiments Onion and garlic salt, seasoned salt, table salt, and sea salt. Canned and packaged gravies. Worcestershire sauce. Tartar sauce. Barbecue sauce. Teriyaki sauce. Soy sauce, including reduced sodium. Steak sauce. Fish sauce. Oyster sauce. Cocktail sauce. Horseradish that you find on the shelf. Regular ketchup and mustard. Meat flavorings and tenderizers. Bouillon cubes. Hot sauce. Tabasco sauce. Marinades. Taco seasonings. Relishes. Fats and Oils Regular salad dressings. Salted butter. Margarine. Ghee. Bacon fat.  Other Potato and tortilla chips. Corn chips and puffs. Salted popcorn and pretzels. Canned or dried soups. Pizza. Frozen entrees and pot pies.  The items listed above may not be a complete list of foods and beverages to avoid. Contact your dietitian for more information.   This information is not intended to replace advice given to you by your health care provider. Make sure you  discuss any questions you have with your health care provider.   Document Released: 05/25/2002 Document Revised: 12/24/2014 Document Reviewed: 10/07/2013 Elsevier Interactive Patient Education [employer]2016 Elsevier Inc.

## 2015-10-10 NOTE — Assessment & Plan Note (Addendum)
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 Started on Prednisone 10 mg daily and urso 250 mg BID. Recent hospitalization with pneumonia, HE. Stable for now and awaiting initial transplant evaluation in [place] next week. Unfortunately, she began drinking again as of mid October. She has asked if she may have 1 beer a day, and I discussed with her at length absolute cessation of all alcohol now. I told her persistent drinking could mean death, loss of candidacy for transplant, further decompensation. She states understanding and caretaker was present as well.   1. Increase lactulose dosing to 4 times a day to achieve 3 soft BMs daily 2. Continue Urso and Prednisone 3. Keep upcoming appt in [place] 4. Further appts for [place] in [place] after initial transplant eval 5. Absolute ETOH cessation 6. Needs TCS/EGD in future after transplant evaluation (+FH of colon cancer, variceal screening) 7. 2 gram sodium diet 8. Return in 4 weeks.

## 2015-10-10 NOTE — Progress Notes (Signed)
Referring Provider: Newman Niplayton, Monica, NP Primary GI: Dr. Darrick PennaFields   Chief Complaint  Patient presents with  . Follow-up    HPI:   Whitney Roy is a 54 y.o. female presenting today with a history of decompensated cirrhosis, query an autoimmune/PBC component, notable hx of ETOH abuse and had been sober for a year until recently when she was found to have an ETOH level of 40 when admitted to the hospital for pneumonia. She had been without insurance, and fortunately, this was able to be obtained after peer to peer review with [employer] representatives. She NOW HAS MEDICAID and an upcoming app[facility] [place].   She was finally able to obtain urso after medicaid implemented. States she drinks a beer here and there but not daily. Confusion is better. Having about 2 bowel movements a day. Appetite is good. Drinking chocolate milk, sodas, juice. Asking if she can have "just one beer a day". Kris MoutonBobby Garland, friend and caretaker, is present. No abdominal pain, overt GI bleeding.     Past Medical History  Diagnosis Date  . CHF (congestive heart failure) (HCC)   . GERD (gastroesophageal reflux disease)   . Neuropathy (HCC)   . Anxiety   . Depression   . Liver cirrhosis (HCC)   . Chronic abdominal pain     Past Surgical History  Procedure Laterality Date  . Tubal ligation      Current Outpatient Prescriptions  Medication Sig Dispense Refill  . amitriptyline (ELAVIL) 10 MG tablet Take 10 mg by mouth at bedtime.    . citalopram (CELEXA) 40 MG tablet Take 40 mg by mouth daily.    . folic acid (FOLVITE) 1 MG tablet Take 1 tablet (1 mg total) by mouth daily.    Marland Kitchen. lactulose (CHRONULAC) 10 GM/15ML solution Take 15ml to 30 ml three to four times a day to have 2-3 soft bowel movements. (Patient taking differently: Take 10-20 g by mouth 4 (four) times daily as needed for moderate constipation. Take 15ml to 30 ml three to four times a day to have 2-3 soft bowel movements.) 1892  mL 3  . Multiple Vitamin (MULTIVITAMIN WITH MINERALS) TABS tablet Take 1 tablet by mouth daily.    . nicotine (NICODERM CQ - DOSED IN MG/24 HOURS) 21 mg/24hr patch Place 1 patch (21 mg total) onto the skin daily. 28 patch 0  . predniSONE (DELTASONE) 10 MG tablet Take 1 tablet (10 mg total) by mouth daily with breakfast. 90 tablet 3  . thiamine 100 MG tablet Take 1 tablet (100 mg total) by mouth daily.    . ursodiol (ACTIGALL) 300 MG capsule Take 1 capsule (300 mg total) by mouth 2 (two) times daily. 60 capsule 3  . ursodiol (URSO) 250 MG tablet Take 1 tablet (250 mg total) by mouth 2 (two) times daily. 60 tablet 3  . azithromycin (ZITHROMAX) 250 MG tablet Take 1 tablet (250 mg total) by mouth daily. Start 10/17. (Patient not taking: Reported on 10/10/2015) 2 tablet 0  . cefUROXime (CEFTIN) 500 MG tablet Take 1 tablet (500 mg total) by mouth 2 (two) times daily with a meal. Start 10/17. (Patient not taking: Reported on 10/10/2015) 8 tablet 0   No current facility-administered medications for this visit.    Allergies as of 10/10/2015 - Review Complete 10/10/2015  Allergen Reaction Noted  . Tylenol [acetaminophen] Swelling 08/08/2015    Family History  Problem Relation Age of Onset  . Colon cancer Father  unsure age of diagnosis  . Liver disease Neg Hx     Social History   Social History  . Marital Status: Single    Spouse Name: N/A  . Number of Children: N/A  . Years of Education: N/A   Social History Main Topics  . Smoking status: Current Some Day Smoker    Types: Cigarettes  . Smokeless tobacco: None  . Alcohol Use: 0.0 oz/week    0 Standard drinks or equivalent per week     Comment: SOBER For one year. Used to drink 4-5 beers daily.   . Drug Use: No  . Sexual Activity: Yes    Birth Control/ Protection: None   Other Topics Concern  . None   Social History Narrative    Review of Systems: As mentioned in HPI   Physical Exam: BP 139/71 mmHg  Pulse 97  Temp(Src)  97.8 F (36.6 C)  Ht 5' (1.524 m)  Wt 121 lb 12.8 oz (55.248 kg)  BMI 23.79 kg/m2 General:   Alert and oriented. No distress noted. Pleasant and cooperative.  Head:  Normocephalic and atraumatic. Eyes:  Conjuctiva clear with notable scleral icterus Abdomen:  +BS, soft,Rounded, anasarca.  Msk:  Symmetrical without gross deformities. Normal posture. Shuffling gait.  Extremities:  Without edema. Neurologic:  Alert and  oriented x4 Psych:  Alert and cooperative. Normal mood and affect.  Lab Results  Component Value Date   WBC 7.5 10/01/2015   HGB 9.7* 10/01/2015   HCT 28.7* 10/01/2015   MCV 96.3 10/01/2015   PLT 100* 10/01/2015   Lab Results  Component Value Date   ALT 34 09/30/2015   AST 76* 09/30/2015   ALKPHOS 119 09/30/2015   BILITOT 14.6* 09/30/2015   Lab Results  Component Value Date   CREATININE 0.52 09/30/2015   BUN 10 09/30/2015   NA 134* 09/30/2015   K 3.6 09/30/2015   CL 100* 09/30/2015   CO2 22 09/30/2015   Lab Results  Component Value Date   INR 2.21* 09/30/2015   INR 2.35* 08/31/2015   INR 2.20* 08/11/2015

## 2015-11-07 ENCOUNTER — Encounter: Payer: Self-pay | Admitting: Gastroenterology

## 2015-11-07 ENCOUNTER — Ambulatory Visit (INDEPENDENT_AMBULATORY_CARE_PROVIDER_SITE_OTHER): Payer: Medicaid Other | Admitting: Gastroenterology

## 2015-11-07 VITALS — BP 154/73 | HR 90 | Temp 99.0°F | Ht 60.0 in | Wt 125.4 lb

## 2015-11-07 DIAGNOSIS — K7030 Alcoholic cirrhosis of liver without ascites: Secondary | ICD-10-CM | POA: Diagnosis not present

## 2015-11-07 MED ORDER — PREDNISONE 10 MG PO TABS: 10.0000 mg | ORAL_TABLET | Freq: Every day | ORAL | Status: DC

## 2015-11-07 NOTE — Progress Notes (Signed)
Referring Provider: No ref. provider found Primary Care Physician:  No PCP Per Patient  Primary GI: Dr. Darrick PennaFields   Chief Complaint  Patient presents with  . Follow-up    HPI:   Whitney Roy is a 54 y.o. female presenting today with a history of decompensated cirrhosis, query an autoimmune/PBC component, notable hx of ETOH abuse and had been sober for a year until recently when she was found to have an ETOH level of 40 when admitted to the hospital for pneumonia. She had been without insurance, and fortunately, this was able to be obtained after peer to peer review with [employer] representatives. She NOW HAS MEDICAID and completed an evaluation with Dr. Baldemar FridayPhilippe Zamor [facility] on 11/2. Kris MoutonBobby Garland, friend and caretaker, present at appt.  Unfortunately, patient has continued to drink daily. She is NOT A TRANSPLANT CANDIDATE DUE TO ONGOING ETOH USE. It is unfortunate but appears that even with completing a 6 month documentation of ETOH cessation, she will likely not survive to maintain sobriety and obtain a liver.  Was having 3 BMs a day but lately decreased in last few days as ran out of lactulose. Sometimes confused. Associated abdominal pain occasionally. 2 beers a day, Bud Ice. SHE IS NOT INTERESTED IN STOPPING AT THIS MOMENT. She states she wants to see her grandchild, due any day now, first. She then states she will think about going into an inpatient facility for rehab/detox. She appears somewhat depressed. I had a lengthy discussion and quite blunt with patient that she would likely not survive the 6 months required prior to transplant but with persistent drinking she is hastening an inevitable decline and death. We also discussed that despite this, it would be beneficial to avoid alcohol and consider an inpatient facility. Caretaker is encouraging to patient and desires that she "not give up". I attempted to have a realistic discussion with patient, but it is  clear that she will continue to drink and is not contemplating cessation seriously. She is aware that she is facing worsening liver failure and ultimately death.    Past Medical History  Diagnosis Date  . CHF (congestive heart failure) (HCC)   . GERD (gastroesophageal reflux disease)   . Neuropathy (HCC)   . Anxiety   . Depression   . Liver cirrhosis (HCC)   . Chronic abdominal pain     Past Surgical History  Procedure Laterality Date  . Tubal ligation      Current Outpatient Prescriptions  Medication Sig Dispense Refill  . amitriptyline (ELAVIL) 10 MG tablet Take 10 mg by mouth at bedtime.    . citalopram (CELEXA) 40 MG tablet Take 40 mg by mouth daily.    . folic acid (FOLVITE) 1 MG tablet Take 1 tablet (1 mg total) by mouth daily.    Marland Kitchen. lactulose (CHRONULAC) 10 GM/15ML solution Take 15ml to 30 ml three to four times a day to have 2-3 soft bowel movements. (Patient taking differently: Take 10-20 g by mouth 4 (four) times daily as needed for moderate constipation. Take 15ml to 30 ml three to four times a day to have 2-3 soft bowel movements.) 1892 mL 3  . Multiple Vitamin (MULTIVITAMIN WITH MINERALS) TABS tablet Take 1 tablet by mouth daily.    . nicotine (NICODERM CQ - DOSED IN MG/24 HOURS) 21 mg/24hr patch Place 1 patch (21 mg total) onto the skin daily. 28 patch 0  . predniSONE (DELTASONE) 10 MG tablet Take 1  tablet (10 mg total) by mouth daily with breakfast. 90 tablet 3  . thiamine 100 MG tablet Take 1 tablet (100 mg total) by mouth daily.    . ursodiol (URSO) 250 MG tablet Take 1 tablet (250 mg total) by mouth 2 (two) times daily. 60 tablet 3   No current facility-administered medications for this visit.    Allergies as of 11/07/2015 - Review Complete 11/07/2015  Allergen Reaction Noted  . Tylenol [acetaminophen] Swelling 08/08/2015    Family History  Problem Relation Age of Onset  . Colon cancer Father     unsure age of diagnosis  . Liver disease Neg Hx     Social  History   Social History  . Marital Status: Single    Spouse Name: N/A  . Number of Children: N/A  . Years of Education: N/A   Social History Main Topics  . Smoking status: Current Some Day Smoker    Types: Cigarettes  . Smokeless tobacco: None  . Alcohol Use: 0.0 oz/week    0 Standard drinks or equivalent per week     Comment: SOBER For one year. Used to drink 4-5 beers daily.   . Drug Use: No  . Sexual Activity: Yes    Birth Control/ Protection: None   Other Topics Concern  . None   Social History Narrative    Review of Systems: As mentioned in HPI   Physical Exam: BP 154/73 mmHg  Pulse 90  Temp(Src) 99 F (37.2 C)  Ht 5' (1.524 m)  Wt 125 lb 6.4 oz (56.881 kg)  BMI 24.49 kg/m2 General:   Alert and oriented. Flat affect. Appears chronically ill.  Head:  Normocephalic and atraumatic. Eyes:  Mild jaundice Mouth:  Oral mucosa pink and moist.  Abdomen:  +BS, distended but soft. No evidence of tense ascites.  Msk:  Slight kyphosis, requires assistance to get on exam table. Appears weaker.  Extremities:  Without edema. Neurologic:  Alert and  oriented x4;  grossly normal neurologically. Psych:  Alert and cooperative.  Lab Results  Component Value Date   WBC 7.5 10/01/2015   HGB 9.7* 10/01/2015   HCT 28.7* 10/01/2015   MCV 96.3 10/01/2015   PLT 100* 10/01/2015   Lab Results  Component Value Date   ALT 34 09/30/2015   AST 76* 09/30/2015   ALKPHOS 119 09/30/2015   BILITOT 14.6* 09/30/2015    Lab Results  Component Value Date   INR 2.21* 09/30/2015   INR 2.35* 08/31/2015   INR 2.20* 08/11/2015

## 2015-11-07 NOTE — Patient Instructions (Signed)
We can arrange a treatment facility for you if you are willing.   Please let me know!  It is important you stop drinking alcohol or else your prognosis is poor and may not live very much longer. We are here to help you if you are willing. Have a special time with your family over the holidays!

## 2015-11-09 NOTE — Assessment & Plan Note (Signed)
Decompensated cirrhosis secondary to ETOH+/- autoimmune/PBC component. Remains on Prednisone 10 mg daily and urso 250 mg BID. Continues to drink despite recommendations and realistic discussion of impending mortality, likely in next few months if she continues this path. Unfortunately, she is not a liver candidate and reports she had been sober up until recently hospitalization where ETOH level was 40. I question if she ever stopped drinking, and she is not desirous of rehab facility until her grandchild is born. I had very blunt discussions with her and her caretaker, Kris MoutonBobby Garland, regarding persistent decline. At this point, I do not have much else to offer her but will continue to see her in close follow-up for basic symptom management as feasible. She has never had a colonoscopy or upper endoscopy, but at this point I see it as futile, specifically with her increasing MELD score and overall decline. I have asked her to call me if she desires inpatient detox, and in the interim we will attempt to find a facility that may be appropriate for her to do so. 3 month return.

## 2015-11-14 ENCOUNTER — Inpatient Hospital Stay (HOSPITAL_COMMUNITY)
Admission: EM | Admit: 2015-11-14 | Discharge: 2015-11-18 | DRG: 433 | Disposition: A | Discharge status: Hospice | Payer: Medicaid Other | Attending: Internal Medicine | Admitting: Internal Medicine

## 2015-11-14 ENCOUNTER — Telehealth: Payer: Self-pay | Admitting: Gastroenterology

## 2015-11-14 ENCOUNTER — Encounter (HOSPITAL_COMMUNITY): Payer: Self-pay | Admitting: Emergency Medicine

## 2015-11-14 DIAGNOSIS — Z800 Family history of malignant neoplasm of digestive organs: Secondary | ICD-10-CM | POA: Diagnosis not present

## 2015-11-14 DIAGNOSIS — D689 Coagulation defect, unspecified: Secondary | ICD-10-CM | POA: Insufficient documentation

## 2015-11-14 DIAGNOSIS — D6959 Other secondary thrombocytopenia: Secondary | ICD-10-CM | POA: Diagnosis present

## 2015-11-14 DIAGNOSIS — F1090 Alcohol use, unspecified, uncomplicated: Secondary | ICD-10-CM | POA: Diagnosis present

## 2015-11-14 DIAGNOSIS — Z66 Do not resuscitate: Secondary | ICD-10-CM | POA: Diagnosis present

## 2015-11-14 DIAGNOSIS — Z789 Other specified health status: Secondary | ICD-10-CM | POA: Diagnosis not present

## 2015-11-14 DIAGNOSIS — Z7189 Other specified counseling: Secondary | ICD-10-CM | POA: Diagnosis not present

## 2015-11-14 DIAGNOSIS — D539 Nutritional anemia, unspecified: Secondary | ICD-10-CM | POA: Diagnosis present

## 2015-11-14 DIAGNOSIS — F17210 Nicotine dependence, cigarettes, uncomplicated: Secondary | ICD-10-CM | POA: Diagnosis present

## 2015-11-14 DIAGNOSIS — K7682 Hepatic encephalopathy: Secondary | ICD-10-CM | POA: Diagnosis present

## 2015-11-14 DIAGNOSIS — I509 Heart failure, unspecified: Secondary | ICD-10-CM | POA: Diagnosis present

## 2015-11-14 DIAGNOSIS — F1020 Alcohol dependence, uncomplicated: Secondary | ICD-10-CM | POA: Diagnosis present

## 2015-11-14 DIAGNOSIS — K7290 Hepatic failure, unspecified without coma: Secondary | ICD-10-CM | POA: Diagnosis not present

## 2015-11-14 DIAGNOSIS — E876 Hypokalemia: Secondary | ICD-10-CM | POA: Insufficient documentation

## 2015-11-14 DIAGNOSIS — K219 Gastro-esophageal reflux disease without esophagitis: Secondary | ICD-10-CM | POA: Diagnosis present

## 2015-11-14 DIAGNOSIS — R410 Disorientation, unspecified: Secondary | ICD-10-CM | POA: Diagnosis not present

## 2015-11-14 DIAGNOSIS — Z515 Encounter for palliative care: Secondary | ICD-10-CM | POA: Diagnosis not present

## 2015-11-14 DIAGNOSIS — K7031 Alcoholic cirrhosis of liver with ascites: Secondary | ICD-10-CM | POA: Diagnosis not present

## 2015-11-14 DIAGNOSIS — K7040 Alcoholic hepatic failure without coma: Secondary | ICD-10-CM | POA: Diagnosis not present

## 2015-11-14 DIAGNOSIS — K7030 Alcoholic cirrhosis of liver without ascites: Secondary | ICD-10-CM | POA: Diagnosis present

## 2015-11-14 DIAGNOSIS — Z7289 Other problems related to lifestyle: Secondary | ICD-10-CM | POA: Diagnosis present

## 2015-11-14 DIAGNOSIS — Z8719 Personal history of other diseases of the digestive system: Secondary | ICD-10-CM

## 2015-11-14 HISTORY — DX: Alcohol abuse, uncomplicated: F10.10

## 2015-11-14 LAB — LIPASE, BLOOD: LIPASE: 41 U/L (ref 11–51)

## 2015-11-14 LAB — CBC WITH DIFFERENTIAL/PLATELET
BASOS ABS: 0.1 10*3/uL (ref 0.0–0.1)
BASOS PCT: 1 %
EOS ABS: 0.2 10*3/uL (ref 0.0–0.7)
EOS PCT: 2 %
HCT: 30.6 % — ABNORMAL LOW (ref 36.0–46.0)
Hemoglobin: 10.3 g/dL — ABNORMAL LOW (ref 12.0–15.0)
Lymphocytes Relative: 18 %
Lymphs Abs: 2 10*3/uL (ref 0.7–4.0)
MCH: 34.1 pg — ABNORMAL HIGH (ref 26.0–34.0)
MCHC: 33.7 g/dL (ref 30.0–36.0)
MCV: 101.3 fL — ABNORMAL HIGH (ref 78.0–100.0)
MONO ABS: 0.8 10*3/uL (ref 0.1–1.0)
MONOS PCT: 7 %
Neutro Abs: 8.1 10*3/uL — ABNORMAL HIGH (ref 1.7–7.7)
Neutrophils Relative %: 72 %
PLATELETS: 107 10*3/uL — AB (ref 150–400)
RBC: 3.02 MIL/uL — ABNORMAL LOW (ref 3.87–5.11)
RDW: 18.2 % — AB (ref 11.5–15.5)
WBC: 11.2 10*3/uL — ABNORMAL HIGH (ref 4.0–10.5)

## 2015-11-14 LAB — ETHANOL: Alcohol, Ethyl (B): <5 mg/dL (ref <5)

## 2015-11-14 LAB — COMPREHENSIVE METABOLIC PANEL
ALK PHOS: 111 U/L (ref 38–126)
ALT: 36 U/L (ref 14–54)
AST: 52 U/L — AB (ref 15–41)
Albumin: 3 g/dL — ABNORMAL LOW (ref 3.5–5.0)
Anion gap: 8 (ref 5–15)
BUN: 13 mg/dL (ref 6–20)
CALCIUM: 9.3 mg/dL (ref 8.9–10.3)
CO2: 22 mmol/L (ref 22–32)
CREATININE: 0.48 mg/dL (ref 0.44–1.00)
Chloride: 104 mmol/L (ref 101–111)
Glucose, Bld: 107 mg/dL — ABNORMAL HIGH (ref 65–99)
Potassium: 2.9 mmol/L — ABNORMAL LOW (ref 3.5–5.1)
SODIUM: 134 mmol/L — AB (ref 135–145)
Total Bilirubin: 21.7 mg/dL (ref 0.3–1.2)
Total Protein: 7.6 g/dL (ref 6.5–8.1)

## 2015-11-14 LAB — AMMONIA: AMMONIA: 97 umol/L — AB (ref 9–35)

## 2015-11-14 MED ORDER — NICOTINE 21 MG/24HR TD PT24
21.0000 mg | MEDICATED_PATCH | Freq: Every day | TRANSDERMAL | Status: DC
  Administered 2015-11-15 – 2015-11-18 (×4): 21 mg via TRANSDERMAL
  Filled 2015-11-14 (×4): qty 1

## 2015-11-14 MED ORDER — PREDNISONE 10 MG PO TABS
10.0000 mg | ORAL_TABLET | Freq: Every day | ORAL | Status: DC
  Administered 2015-11-15 – 2015-11-16 (×2): 10 mg via ORAL
  Filled 2015-11-14 (×2): qty 1

## 2015-11-14 MED ORDER — URSODIOL 250 MG PO TABS
250.0000 mg | ORAL_TABLET | Freq: Two times a day (BID) | ORAL | Status: DC
  Filled 2015-11-14 (×3): qty 1

## 2015-11-14 MED ORDER — FOLIC ACID 1 MG PO TABS
1.0000 mg | ORAL_TABLET | Freq: Every day | ORAL | Status: DC
  Administered 2015-11-15 – 2015-11-18 (×4): 1 mg via ORAL
  Filled 2015-11-14 (×4): qty 1

## 2015-11-14 MED ORDER — SODIUM CHLORIDE 0.9 % IV SOLN
INTRAVENOUS | Status: DC
Start: 2015-11-14 — End: 2015-11-18
  Administered 2015-11-14 – 2015-11-16 (×3): via INTRAVENOUS

## 2015-11-14 MED ORDER — FOLIC ACID 1 MG PO TABS: 1.0000 mg | ORAL_TABLET | Freq: Every day | ORAL | Status: DC

## 2015-11-14 MED ORDER — VITAMIN B-1 100 MG PO TABS
100.0000 mg | ORAL_TABLET | Freq: Every day | ORAL | Status: DC
Start: 2015-11-14 — End: 2015-11-18
  Administered 2015-11-15 – 2015-11-18 (×4): 100 mg via ORAL
  Filled 2015-11-14 (×4): qty 1

## 2015-11-14 MED ORDER — ONDANSETRON HCL 4 MG/2ML IJ SOLN: 4.0000 mg | Freq: Four times a day (QID) | INTRAMUSCULAR | Status: DC | PRN

## 2015-11-14 MED ORDER — VITAMIN D 1000 UNITS PO TABS
2000.0000 [IU] | ORAL_TABLET | Freq: Every day | ORAL | Status: DC
  Administered 2015-11-15 – 2015-11-18 (×4): 2000 [IU] via ORAL
  Filled 2015-11-14 (×4): qty 2

## 2015-11-14 MED ORDER — ONDANSETRON HCL 4 MG PO TABS: 4.0000 mg | ORAL_TABLET | Freq: Four times a day (QID) | ORAL | Status: DC | PRN

## 2015-11-14 MED ORDER — SODIUM CHLORIDE 0.9 % IV BOLUS (SEPSIS)
500.0000 mL | Freq: Once | INTRAVENOUS | Status: AC
  Administered 2015-11-14: 500 mL via INTRAVENOUS

## 2015-11-14 MED ORDER — FUROSEMIDE 20 MG PO TABS
20.0000 mg | ORAL_TABLET | Freq: Every day | ORAL | Status: DC
  Administered 2015-11-15 – 2015-11-18 (×4): 20 mg via ORAL
  Filled 2015-11-14 (×4): qty 1

## 2015-11-14 MED ORDER — LACTULOSE 10 GM/15ML PO SOLN
20.0000 g | Freq: Three times a day (TID) | ORAL | Status: DC
  Administered 2015-11-14 – 2015-11-18 (×11): 20 g via ORAL
  Filled 2015-11-14 (×10): qty 30

## 2015-11-14 NOTE — ED Notes (Signed)
CRITICAL VALUE ALERT  Critical value received: total bilirubin 21.7  Date of notification:  11/14/15  Time of notification:  1645  Critical value read back: yes  Nurse who received alert:  Garald BraverJ. Kelita Wallis rn  MD notified (1st page):  delo  Time of first page:  1657  MD notified (2nd page):  Time of second page:  Responding MD:  delo  Time MD responded:  delo

## 2015-11-14 NOTE — Telephone Encounter (Signed)
Checked on rehab for [facility] and for other facilites. Pt will have to be a voluntary commitment from ER or Pay fee upfront of 15,000 to other facilities due to her having Medicaid

## 2015-11-14 NOTE — ED Notes (Signed)
Pt's caretaker states that pt is here for increased confusion.  Has end stage liver disease and continues to drink.

## 2015-11-14 NOTE — Telephone Encounter (Signed)
Spoke with Kris MoutonBobby Garland, he said he has [facility]. He said she has been having increased confusion, and he noticed she could barely walk. He thinks she may have fallen because she has a bruise on her breast. There was also blood in the toilet. He said her grandbaby was born 2 days ago and she told him yesterday that she was ready to go to Rehab. He was just wanting me to let AS know what was going on, so that she can work on referral to rehab.

## 2015-11-14 NOTE — Telephone Encounter (Signed)
PATIENT FRIEND CALLED AND STATED THAT SHE IS HAVING PROBLEMS WITH ANXIETY    THINKS SHE FELL AND STATED ANNA  WAS GOING TO TRY TO GET HER INTO REBAB IN [place].   CALL [name] AT [phone]  (HER REPRESENTATIVE)  WANTS TO KNOW WHAT TO DO, THINKS HE NEEDS TO TAKE HER TO THE HOSPITAL

## 2015-11-14 NOTE — H&P (Signed)
[facility] History and Physical  Whitney Roy AOZ:[id number] DOB: 11/03/1961 DOA: 11/14/2015  Referring physician: ER PCP: No PCP Per Patient   Chief Complaint: Altered mental status  HPI: Whitney Roy is a 54 y.o. female  This is a 54 year old lady who is known to have cirrhosis of the liver, likely from a combination of alcoholism and possible autoimmune disease who now presents with altered mental status. When she was seen in the gastroenterology office just over a week ago, it was clear that she has continued alcoholism and that she would not be a candidate for liver transplant. It is also not clear whether she has continued to take her lactulose as prescribed. The caregiver who is present with her at the bedside tells me that he also saw blood in the toilet commode couple of days ago and he was concerned about this too. She denies any hematemesis. However she is confused and she is not able to give me any clear history.   Review of Systems:  Unable to give me any review of systems because of altered mental status.  Past Medical History  Diagnosis Date  . CHF (congestive heart failure) (HCC)   . GERD (gastroesophageal reflux disease)   . Neuropathy (HCC)   . Anxiety   . Depression   . Liver cirrhosis (HCC)   . Chronic abdominal pain   . ETOH abuse    Past Surgical History  Procedure Laterality Date  . Tubal ligation     Social History:  reports that she has been smoking Cigarettes.  She does not have any smokeless tobacco history on file. She reports that she drinks alcohol. She reports that she does not use illicit drugs.  Allergies  Allergen Reactions  . Tylenol [Acetaminophen] Swelling    Family History  Problem Relation Age of Onset  . Colon cancer Father     unsure age of diagnosis  . Liver disease Neg Hx     Prior to Admission medications   Medication Sig Start Date End Date Taking? Authorizing Provider  amitriptyline (ELAVIL) 10 MG tablet Take  10 mg by mouth at bedtime.   Yes Historical Provider, MD  Cholecalciferol (VITAMIN D) 2000 UNITS CAPS Take 1 capsule by mouth daily.   Yes Historical Provider, MD  citalopram (CELEXA) 40 MG tablet Take 40 mg by mouth daily.   Yes Historical Provider, MD  folic acid (FOLVITE) 1 MG tablet Take 1 tablet (1 mg total) by mouth daily. 10/02/15  Yes Standley Brooking, MD  furosemide (LASIX) 20 MG tablet Take 20 mg by mouth daily.   Yes Historical Provider, MD  lactulose (CHRONULAC) 10 GM/15ML solution Take 15ml to 30 ml three to four times a day to have 2-3 soft bowel movements. Patient taking differently: Take 20 g by mouth 3 (three) times daily. have 2-3 soft bowel movements. 08/31/15  Yes Nira Retort, NP  nicotine (NICODERM CQ - DOSED IN MG/24 HOURS) 21 mg/24hr patch Place 1 patch (21 mg total) onto the skin daily. 10/02/15  Yes Standley Brooking, MD  predniSONE (DELTASONE) 10 MG tablet Take 1 tablet (10 mg total) by mouth daily with breakfast. 11/07/15  Yes Nira Retort, NP  ursodiol (URSO) 250 MG tablet Take 1 tablet (250 mg total) by mouth 2 (two) times daily. 08/25/15  Yes Nira Retort, NP  thiamine 100 MG tablet Take 1 tablet (100 mg total) by mouth daily. Patient not taking: Reported on 11/14/2015 10/02/15   Standley Brooking,  MD   Physical Exam: Filed Vitals:   11/14/15 1434 11/14/15 1453 11/14/15 1754  BP: 148/63 168/67 153/66  Pulse: 78 75 86  Temp: 98.2 F (36.8 C) 99.7 F (37.6 C) 100.3 F (37.9 C)  TempSrc: Oral Oral Oral  Resp: 16 18 15   Height: 5' (1.524 m)    Weight: 56.246 kg (124 lb)    SpO2: 100% 100% 100%    Wt Readings from Last 3 Encounters:  11/14/15 56.246 kg (124 lb)  11/07/15 56.881 kg (125 lb 6.4 oz)  10/10/15 55.248 kg (121 lb 12.8 oz)    General:  Appears  jaundiced. She has hepatic flap. Eyes: PERRL, normal lids, irises & conjunctiva ENT: grossly normal hearing, lips & tongue Neck: no LAD, masses or thyromegaly Cardiovascular: RRR, no m/r/g. No LE  edema. Telemetry: SR, no arrhythmias  Respiratory: CTA bilaterally, no w/r/r. Normal respiratory effort. Abdomen: soft, ntnd Skin: no rash or induration seen on limited exam Musculoskeletal: grossly normal tone BUE/BLE Psychiatric: grossly normal mood and affect, speech fluent and appropriate Neurologic: grossly non-focal.          Labs on Admission:  Basic Metabolic Panel:  Recent Labs Lab 11/14/15 1553  NA 134*  K 2.9*  CL 104  CO2 22  GLUCOSE 107*  BUN 13  CREATININE 0.48  CALCIUM 9.3   Liver Function Tests:  Recent Labs Lab 11/14/15 1553  AST 52*  ALT 36  ALKPHOS 111  BILITOT 21.7*  PROT 7.6  ALBUMIN 3.0*    Recent Labs Lab 11/14/15 1553  LIPASE 41    Recent Labs Lab 11/14/15 1553  AMMONIA 97*   CBC:  Recent Labs Lab 11/14/15 1553  WBC 11.2*  NEUTROABS 8.1*  HGB 10.3*  HCT 30.6*  MCV 101.3*  PLT 107*   Cardiac Enzymes: No results for input(s): CKTOTAL, CKMB, CKMBINDEX, TROPONINI in the last 168 hours.  BNP (last 3 results) No results for input(s): BNP in the last 8760 hours.  ProBNP (last 3 results) No results for input(s): PROBNP in the last 8760 hours.  CBG: No results for input(s): GLUCAP in the last 168 hours.  Radiological Exams on Admission: No results found.    Assessment/Plan   1. 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 IV fluids, lactulose and monitor closely. 2. Cirrhosis of the liver. Her diseases clinic decompensated and deteriorating. I wonder whether hospice care should be and attained. I will request palliative medicine consultation. 3. Alcoholism. This appears to be an ongoing issue.   She'll be admitted to the medical floor. Further recommendations will depend on patient's hospital progress.    Code Status: FULL  code for now. This will need to be discussed.   DVT Prophylaxis: SCDs   Family Communication: I discussed the plan with the caregiver at the  bedside.    Disposition Plan: depending on progress.   Time spent: 60 minutes.   Wilson SingerGOSRANI,NIMISH C [facility] Pager [phone].

## 2015-11-14 NOTE — ED Notes (Signed)
Report given to Brandi RN on 300 

## 2015-11-14 NOTE — ED Notes (Signed)
Called to give report, RN unavailable at this time. 

## 2015-11-14 NOTE — ED Provider Notes (Signed)
CSN: [id number]     Arrival date & time 11/14/15  1429 History   First MD Initiated Contact with Patient 11/14/15 1441     Chief Complaint  Patient presents with  . Altered Mental Status     (Consider location/radiation/quality/duration/timing/severity/associated sxs/prior Treatment) HPI Comments: Patient is a 54 year old female with history of alcoholic cirrhosis who continues to drink. She is brought by her neighbor for evaluation of increased somnolence, lethargy. This has worsened over the past several days. The patient adds little to history secondary to severity of medical condition and the majority of history was taken from the patient's neighbor who is present at bedside.  Patient is a 54 y.o. female presenting with altered mental status. The history is provided by the patient (A neighbor).  Altered Mental Status Presenting symptoms: behavior changes, confusion and lethargy   Severity:  Moderate Most recent episode:  2 days ago Timing:  Constant Progression:  Worsening Chronicity:  Recurrent Context: alcohol use     Past Medical History  Diagnosis Date  . CHF (congestive heart failure) (HCC)   . GERD (gastroesophageal reflux disease)   . Neuropathy (HCC)   . Anxiety   . Depression   . Liver cirrhosis (HCC)   . Chronic abdominal pain   . ETOH abuse    Past Surgical History  Procedure Laterality Date  . Tubal ligation     Family History  Problem Relation Age of Onset  . Colon cancer Father     unsure age of diagnosis  . Liver disease Neg Hx    Social History  Substance Use Topics  . Smoking status: Current Some Day Smoker    Types: Cigarettes  . Smokeless tobacco: None  . Alcohol Use: 0.0 oz/week    0 Standard drinks or equivalent per week     Comment: drinks daily   OB History    No data available     Review of Systems  Constitutional: Positive for fatigue.  Psychiatric/Behavioral: Positive for confusion.  All other systems reviewed and are  negative.     Allergies  Tylenol  Home Medications   Prior to Admission medications   Medication Sig Start Date End Date Taking? Authorizing Provider  amitriptyline (ELAVIL) 10 MG tablet Take 10 mg by mouth at bedtime.    Historical Provider, MD  citalopram (CELEXA) 40 MG tablet Take 40 mg by mouth daily.    Historical Provider, MD  folic acid (FOLVITE) 1 MG tablet Take 1 tablet (1 mg total) by mouth daily. 10/02/15   Standley Brooking, MD  lactulose (CHRONULAC) 10 GM/15ML solution Take 15ml to 30 ml three to four times a day to have 2-3 soft bowel movements. Patient taking differently: Take 10-20 g by mouth 4 (four) times daily as needed for moderate constipation. Take 15ml to 30 ml three to four times a day to have 2-3 soft bowel movements. 08/31/15   Nira Retort, NP  Multiple Vitamin (MULTIVITAMIN WITH MINERALS) TABS tablet Take 1 tablet by mouth daily. 10/02/15   Standley Brooking, MD  nicotine (NICODERM CQ - DOSED IN MG/24 HOURS) 21 mg/24hr patch Place 1 patch (21 mg total) onto the skin daily. 10/02/15   Standley Brooking, MD  predniSONE (DELTASONE) 10 MG tablet Take 1 tablet (10 mg total) by mouth daily with breakfast. 11/07/15   Nira Retort, NP  thiamine 100 MG tablet Take 1 tablet (100 mg total) by mouth daily. 10/02/15   Standley Brooking, MD  ursodiol (  URSO) 250 MG tablet Take 1 tablet (250 mg total) by mouth 2 (two) times daily. 08/25/15   Nira RetortAnna W Sams, NP   BP 168/67 mmHg  Pulse 75  Temp(Src) 99.7 F (37.6 C) (Oral)  Resp 18  Ht 5' (1.524 m)  Wt 124 lb (56.246 kg)  BMI 24.22 kg/m2  SpO2 100% Physical Exam  Constitutional: She is oriented to person, place, and time. She appears well-developed and well-nourished. No distress.  Patient is a 54 year old female in no acute distress. She is chronically ill-appearing. She is somewhat sluggish and somnolent.  HENT:  Head: Normocephalic and atraumatic.  Eyes: EOM are normal. Pupils are equal, round, and reactive to light.   Sclera are icteric.  Neck: Normal range of motion. Neck supple.  Cardiovascular: Normal rate and regular rhythm.  Exam reveals no gallop and no friction rub.   No murmur heard. Pulmonary/Chest: Effort normal and breath sounds normal. No respiratory distress. She has no wheezes.  Abdominal: Soft. Bowel sounds are normal. She exhibits no distension. There is no tenderness.  Musculoskeletal: Normal range of motion.  Neurological: She is alert and oriented to person, place, and time. No cranial nerve deficit. Coordination abnormal.  There is generalized weakness noted throughout. She is awake, but slow to respond. She is not oriented to date, place, or situation. She does move all extremities. There does appear to be asterixis present.  Skin: Skin is warm and dry. She is not diaphoretic.  Nursing note and vitals reviewed.   ED Course  Procedures (including critical care time) Labs Review Labs Reviewed  COMPREHENSIVE METABOLIC PANEL  ETHANOL  LIPASE, BLOOD  CBC WITH DIFFERENTIAL/PLATELET  AMMONIA    Imaging Review No results found. I have personally reviewed and evaluated these images and lab results as part of my medical decision-making.   EKG Interpretation   Date/Time:  Monday November 14 2015 14:48:54 EST Ventricular Rate:  74 PR Interval:  142 QRS Duration: 82 QT Interval:  423 QTC Calculation: 469 R Axis:   31 Text Interpretation:  Sinus rhythm Confirmed by Mirl Hillery  MD, Tammy Ericsson ([id number])  on 11/14/2015 3:03:40 PM      MDM   Final diagnoses:  None    Patient presents with increased somnolence. She has a history of cirrhosis related to chronic alcoholism. Her workup today reveals an ammonia level of 97 along with a bilirubin which has markedly increased to 21.7. She has asterixis on exam and this appears to be a hepatic encephalopathy. I've spoken with Dr. Karilyn CotaGosrani who agrees to admit the patient.    Geoffery Lyonsouglas Yilin Weedon, MD 11/14/15 [phone]

## 2015-11-14 NOTE — ED Notes (Signed)
Patient up by herself did not use call button. Patient voided on herself, care done at beside. Placed yellow grip socks and fall bracelet on patient. Patient resting at this time.

## 2015-11-14 NOTE — Progress Notes (Signed)
NO PCP PER PATIENT °

## 2015-11-14 NOTE — ED Notes (Signed)
Admitting MD and friend are still at the bedside.  Pt is calm and still appears tired.

## 2015-11-15 DIAGNOSIS — Z515 Encounter for palliative care: Secondary | ICD-10-CM | POA: Insufficient documentation

## 2015-11-15 DIAGNOSIS — D689 Coagulation defect, unspecified: Secondary | ICD-10-CM | POA: Insufficient documentation

## 2015-11-15 DIAGNOSIS — K7290 Hepatic failure, unspecified without coma: Secondary | ICD-10-CM

## 2015-11-15 DIAGNOSIS — Z7189 Other specified counseling: Secondary | ICD-10-CM

## 2015-11-15 DIAGNOSIS — K7030 Alcoholic cirrhosis of liver without ascites: Secondary | ICD-10-CM

## 2015-11-15 LAB — COMPREHENSIVE METABOLIC PANEL
ALK PHOS: 74 U/L (ref 38–126)
ALT: 25 U/L (ref 14–54)
AST: 40 U/L (ref 15–41)
Albumin: 2.4 g/dL — ABNORMAL LOW (ref 3.5–5.0)
Anion gap: 8 (ref 5–15)
BILIRUBIN TOTAL: 19.5 mg/dL — AB (ref 0.3–1.2)
BUN: 10 mg/dL (ref 6–20)
CALCIUM: 8.6 mg/dL — AB (ref 8.9–10.3)
CO2: 19 mmol/L — AB (ref 22–32)
CREATININE: 0.48 mg/dL (ref 0.44–1.00)
Chloride: 110 mmol/L (ref 101–111)
Glucose, Bld: 81 mg/dL (ref 65–99)
Potassium: 3.3 mmol/L — ABNORMAL LOW (ref 3.5–5.1)
Sodium: 137 mmol/L (ref 135–145)
TOTAL PROTEIN: 6.5 g/dL (ref 6.5–8.1)

## 2015-11-15 LAB — CBC
HCT: 26.3 % — ABNORMAL LOW (ref 36.0–46.0)
Hemoglobin: 9 g/dL — ABNORMAL LOW (ref 12.0–15.0)
MCH: 34.4 pg — AB (ref 26.0–34.0)
MCHC: 34.2 g/dL (ref 30.0–36.0)
MCV: 100.4 fL — ABNORMAL HIGH (ref 78.0–100.0)
PLATELETS: 82 10*3/uL — AB (ref 150–400)
RBC: 2.62 MIL/uL — AB (ref 3.87–5.11)
RDW: 18.3 % — ABNORMAL HIGH (ref 11.5–15.5)
WBC: 10.8 10*3/uL — AB (ref 4.0–10.5)

## 2015-11-15 LAB — MAGNESIUM: Magnesium: 1.7 mg/dL (ref 1.7–2.4)

## 2015-11-15 LAB — PROTIME-INR
INR: 2.47 — ABNORMAL HIGH (ref 0.00–1.49)
PROTHROMBIN TIME: 26.4 s — AB (ref 11.6–15.2)

## 2015-11-15 MED ORDER — POTASSIUM CHLORIDE 10 MEQ/100ML IV SOLN
10.0000 meq | INTRAVENOUS | Status: AC
  Administered 2015-11-15 (×6): 10 meq via INTRAVENOUS
  Filled 2015-11-15 (×6): qty 100

## 2015-11-15 MED ORDER — OXYCODONE HCL 5 MG PO TABS
5.0000 mg | ORAL_TABLET | Freq: Three times a day (TID) | ORAL | Status: DC | PRN
  Administered 2015-11-15 – 2015-11-16 (×3): 5 mg via ORAL
  Filled 2015-11-15 (×3): qty 1

## 2015-11-15 MED ORDER — IBUPROFEN 400 MG PO TABS: 200.0000 mg | ORAL_TABLET | Freq: Four times a day (QID) | ORAL | Status: DC | PRN

## 2015-11-15 MED ORDER — LORAZEPAM 1 MG PO TABS
1.0000 mg | ORAL_TABLET | Freq: Four times a day (QID) | ORAL | Status: AC | PRN
  Administered 2015-11-17: 1 mg via ORAL
  Filled 2015-11-15: qty 1

## 2015-11-15 MED ORDER — URSODIOL 300 MG PO CAPS
300.0000 mg | ORAL_CAPSULE | Freq: Two times a day (BID) | ORAL | Status: DC
  Administered 2015-11-15 – 2015-11-18 (×7): 300 mg via ORAL
  Filled 2015-11-15 (×14): qty 1

## 2015-11-15 MED ORDER — LORAZEPAM 2 MG/ML IJ SOLN: 1.0000 mg | Freq: Four times a day (QID) | INTRAMUSCULAR | Status: AC | PRN

## 2015-11-15 MED ORDER — ADULT MULTIVITAMIN W/MINERALS CH
1.0000 | ORAL_TABLET | Freq: Every day | ORAL | Status: DC
  Administered 2015-11-15 – 2015-11-18 (×4): 1 via ORAL
  Filled 2015-11-15 (×4): qty 1

## 2015-11-15 NOTE — Consult Note (Signed)
Referring Provider: Dr. Karilyn Cota Primary Care Physician:  No PCP Per Patient Primary Gastroenterologist:  Dr. Darrick Penna   Date of Admission: 11/14/15 Date of Consultation: 11/15/15  Reason for Consultation:  Hepatic encephalopathy   HPI:  Whitney Roy is a 54 y.o. year old female who has been followed as an outpatient for the last few months with a history of decompensated cirrhosis, query an autoimmune/PBC component, notable hx of ETOH abuse and had been sober for a year until recently when she was found to have an ETOH level of 40 when admitted to the hospital for pneumonia. She had been without insurance, and fortunately, this was able to be obtained after peer to peer review with [employer]. She NOW HAS MEDICAID and completed an evaluation with Dr. Baldemar Friday at the [facility] on 11/2. She was seen 11/07/15 by myself as an outpatient. UNFORTUNATELY, she has continued to drink daily. She is not a liver transplant candidate. On 11/21, she was not interested in alcohol cessation until her granddaughter was born. I had a very blunt and realistic discussion with patient that she would likely not survive the 6 months needed to document alcohol and drug abstinence. MELD 28. Bilirubin continues to climb.   Poor historian. Tells me she saw a small amount of blood in stool recently. Poor appetite. No fever/chills. No N/V. No abdominal pain. Unsure when she had her last drink of ETOH. Wants something to help her sleep. Confused regarding year and city. Unclear if she has been taking lactulose as prescribed.   Past Medical History  Diagnosis Date  . CHF (congestive heart failure) (HCC)   . GERD (gastroesophageal reflux disease)   . Neuropathy (HCC)   . Anxiety   . Depression   . Liver cirrhosis (HCC)   . Chronic abdominal pain   . ETOH abuse     Past Surgical History  Procedure Laterality Date  . Tubal ligation      Prior to Admission medications    Medication Sig Start Date End Date Taking? Authorizing Provider  amitriptyline (ELAVIL) 10 MG tablet Take 10 mg by mouth at bedtime.   Yes Historical Provider, MD  Cholecalciferol (VITAMIN D) 2000 UNITS CAPS Take 1 capsule by mouth daily.   Yes Historical Provider, MD  citalopram (CELEXA) 40 MG tablet Take 40 mg by mouth daily.   Yes Historical Provider, MD  folic acid (FOLVITE) 1 MG tablet Take 1 tablet (1 mg total) by mouth daily. 10/02/15  Yes Standley Brooking, MD  furosemide (LASIX) 20 MG tablet Take 20 mg by mouth daily.   Yes Historical Provider, MD  lactulose (CHRONULAC) 10 GM/15ML solution Take 15ml to 30 ml three to four times a day to have 2-3 soft bowel movements. Patient taking differently: Take 20 g by mouth 3 (three) times daily. have 2-3 soft bowel movements. 08/31/15  Yes Nira Retort, NP  nicotine (NICODERM CQ - DOSED IN MG/24 HOURS) 21 mg/24hr patch Place 1 patch (21 mg total) onto the skin daily. 10/02/15  Yes Standley Brooking, MD  predniSONE (DELTASONE) 10 MG tablet Take 1 tablet (10 mg total) by mouth daily with breakfast. 11/07/15  Yes Nira Retort, NP  ursodiol (URSO) 250 MG tablet Take 1 tablet (250 mg total) by mouth 2 (two) times daily. 08/25/15  Yes Nira Retort, NP  thiamine 100 MG tablet Take 1 tablet (100 mg total) by mouth daily. Patient not taking: Reported on 11/14/2015 10/02/15  Standley Brookinganiel P Goodrich, MD    Current Facility-Administered Medications  Medication Dose Route Frequency Provider Last Rate Last Dose  . 0.9 %  sodium chloride infusion   Intravenous Continuous Wilson SingerNimish C Gosrani, MD 75 mL/hr at 11/15/15 0644    . cholecalciferol (VITAMIN D) tablet 2,000 Units  2,000 Units Oral Daily Wilson SingerNimish C Gosrani, MD   2,000 Units at 11/14/15 1845  . folic acid (FOLVITE) tablet 1 mg  1 mg Oral Daily Nimish C Gosrani, MD   1 mg at 11/14/15 2117  . furosemide (LASIX) tablet 20 mg  20 mg Oral Daily Nimish Normajean Glasgow Gosrani, MD   20 mg at 11/14/15 1845  . lactulose (CHRONULAC) 10  GM/15ML solution 20 g  20 g Oral TID Wilson SingerNimish C Gosrani, MD   20 g at 11/14/15 2254  . nicotine (NICODERM CQ - dosed in mg/24 hours) patch 21 mg  21 mg Transdermal Daily Nimish C Karilyn CotaGosrani, MD   21 mg at 11/14/15 1845  . ondansetron (ZOFRAN) tablet 4 mg  4 mg Oral Q6H PRN Nimish Normajean Glasgow Gosrani, MD       Or  . ondansetron (ZOFRAN) injection 4 mg  4 mg Intravenous Q6H PRN Nimish C Gosrani, MD      . predniSONE (DELTASONE) tablet 10 mg  10 mg Oral Q breakfast Nimish C Gosrani, MD      . thiamine (VITAMIN B-1) tablet 100 mg  100 mg Oral Daily Nimish C Karilyn CotaGosrani, MD   100 mg at 11/14/15 1845  . ursodiol (ACTIGALL) tablet 250 mg  250 mg Oral BID Wilson SingerNimish C Gosrani, MD        Allergies as of 11/14/2015 - Review Complete 11/14/2015  Allergen Reaction Noted  . Tylenol [acetaminophen] Swelling 08/08/2015    Family History  Problem Relation Age of Onset  . Colon cancer Father     unsure age of diagnosis  . Liver disease Neg Hx     Social History   Social History  . Marital Status: Single    Spouse Name: N/A  . Number of Children: N/A  . Years of Education: N/A   Occupational History  . Not on file.   Social History Main Topics  . Smoking status: Current Some Day Smoker    Types: Cigarettes  . Smokeless tobacco: Not on file  . Alcohol Use: 0.0 oz/week    0 Standard drinks or equivalent per week     Comment: drinks daily  . Drug Use: No  . Sexual Activity: Yes    Birth Control/ Protection: None   Other Topics Concern  . Not on file   Social History Narrative    Review of Systems: As mentioned in HPI.   Physical Exam: Vital signs in last 24 hours: Temp:  [98.2 F (36.8 C)-100.3 F (37.9 C)] 99.5 F (37.5 C) (11/29 0549) Pulse Rate:  [75-86] 83 (11/29 0549) Resp:  [15-20] 20 (11/29 0549) BP: (126-168)/(43-67) 126/43 mmHg (11/29 0549) SpO2:  [100 %] 100 % (11/29 0549) Weight:  [124 lb (56.246 kg)] 124 lb (56.246 kg) (11/28 1434)   General:   Drowsy. Appears chronically ill.   Head:  Normocephalic and atraumatic. Eyes:  +scleral icterus Ears:  Normal auditory acuity. Nose:  No deformity, discharge,  or lesions. Lungs:  Clear throughout to auscultation.   No wheezes, crackles, or rhonchi. No acute distress. Heart:  Regular rate and rhythm Abdomen:  +BS, distended but soft. Rounded AP diameter.  Rectal:  Deferred until time of colonoscopy.  Msk:  Symmetrical without gross deformities. Normal posture. Extremities:  Without  edema. Neurologic:  Oriented to person but intermittently confused Skin:  Intact without significant lesions or rashes. Psych:  Alert and cooperative. Normal mood and affect.  Intake/Output from previous day: 11/28 0701 - 11/29 0700 In: 600 [I.V.:100; IV Piggyback:500] Out: -  Intake/Output this shift:    Lab Results:  Recent Labs  11/14/15 1553 11/15/15 0632  WBC 11.2* 10.8*  HGB 10.3* 9.0*  HCT 30.6* 26.3*  PLT 107* 82*   BMET  Recent Labs  11/14/15 1553 11/15/15 0632  NA 134* 137  K 2.9* 3.3*  CL 104 110  CO2 22 19*  GLUCOSE 107* 81  BUN 13 10  CREATININE 0.48 0.48  CALCIUM 9.3 8.6*   LFT  Recent Labs  11/14/15 1553 11/15/15 0632  PROT 7.6 6.5  ALBUMIN 3.0* 2.4*  AST 52* 40  ALT 36 25  ALKPHOS 111 74  BILITOT 21.7* 19.5*   PT/INR  Recent Labs  11/15/15 0632  LABPROT 26.4*  INR 2.47*    Impression: 55 year old female with ETOH cirrhosis, question of autoimmune component, nearing end of life. MELD 28.. Not a candidate for transplantation due to persistent ETOH use. Low-volume hematochezia likely multifactorial in setting of coagulopathy but resolved now. Chronic anemia without acute blood loss anemia noted. No need for colonoscopy/EGD with grim prognosis. 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 Titrate lactulose to 3 soft BMs a day. Definitely agree with palliative care consult/hospice at this point.   Plan: Lactulose titrated to achieve 3 soft BMs per day May  continue prednisone and urso although this will not change clinical course Diet as tolerated Palliative care consult highly recommended.  Nira Retort, ANP-BC [facility]     LOS: 1 day    11/15/2015, 8:02 AM

## 2015-11-15 NOTE — Progress Notes (Signed)
CRITICAL VALUE ALERT  Critical value received:  Total Bilirubin 19.5  Date of notification:  11/15/2015  Time of notification:  0740  Critical value read back:Yes.    Nurse who received alert:  Billie LadeValdese Micalah Cabezas  MD notified (1st page):  Dr Virginia RochesterSendil Krishnan  Time of first page:  [phone]  MD notified (2nd page):  Time of second page:  Responding MD:  Dr Virginia RochesterSendil Krishnan  Time MD responded:  [phone]

## 2015-11-15 NOTE — Care Management Note (Signed)
Case Management Note  Patient Details  Name: Whitney Roy MRN: [id number] Date of Birth: 10/10/1961  Subjective/Objective:                  Pt admitted from home with hepatic encephalopathy. Pt lives alone but has a friend, [name], that is with pt most of the time. Pt is independent with ADL's. [facility] [place].  Action/Plan: Will continue to follow for discharge planning needs. [facility] CAP aide services.  Expected Discharge Date:  11/16/15               Expected Discharge Plan:  Home w Home Health Services  In-House Referral:  Hospice / Palliative Care  Discharge planning Services  CM Consult  Post Acute Care Choice:  Home Health Choice offered to:  Patient  DME Arranged:    DME Agency:     HH Arranged:    HH Agency:     Status of Service:  In process, will continue to follow  Medicare Important Message Given:    Date Medicare IM Given:    Medicare IM give by:    Date Additional Medicare IM Given:    Additional Medicare Important Message give by:     If discussed at Long Length of Stay Meetings, dates discussed:    Additional Comments:  Cheryl FlashBlackwell, Shawntina Diffee Crowder, RN 11/15/2015, 2:00 PM

## 2015-11-15 NOTE — Progress Notes (Signed)
Pt is unable to follow direction at this time to complete neuro checks due to altered mental status. Will continue to monitor.

## 2015-11-15 NOTE — Telephone Encounter (Signed)
Pt is inpatient

## 2015-11-15 NOTE — Progress Notes (Signed)
PROGRESS NOTE  Whitney Roy WNU:[id number] DOB: Mar 20, 1961 DOA: 11/14/2015 PCP: No PCP Per Patient  HPI/Recap of past 24 hours: Patient is a 54 year old female with past medical history of ongoing alcohol abuse and decompensated cirrhosis who lives by herself and came into the emergency room on 11/28 for confusion. Patient felt to be in acute hepatic encephalopathy, noting elevated ammonia level. It was suspected that she's been noncompliant with this. Started on lactulose and GI and palliative care consulted.  Today, patient with no complaints. She denies any abdominal pain. She states that she's feeling better. She still is somewhat confused, oriented only 2.  Assessment/Plan: Active Problems:   Cirrhosis, alcoholic (HCC) from alcohol use with secondary hepatic encephalopathy, thrombocytopenia and coagulopathy: Bilirubin on admission at 21, down to 19 today. Followed by GI. They have been following her as an outpatient. Patient continues to drink. She is not a liver transplant candidate. Given still some confusion and I'm unsure of her baseline, we'll continue lactulose and reassess tomorrow. I'm concerned about her living by herself. Seem up out of care who feels that likely she will not lived past the next 6 months and is hospice eligible. Left message with patient's son.  Code Status: full code   Family Communication: left message with son    Disposition Plan: decision to be made by family for where patient is to go from here.   anticipate discharge next 24-48 hours    Consultants:  Gastroenterology  Palliative care    Procedures: None  Antibiotics:  None     Objective: BP 126/63 mmHg  Pulse 78  Temp(Src) 98.4 F (36.9 C) (Oral)  Resp 20  Ht 5' (1.524 m)  Wt 56.246 kg (124 lb)  BMI 24.22 kg/m2  SpO2 100%  Intake/Output Summary (Last 24 hours) at 11/15/15 1652 Last data filed at 11/15/15 0830  Gross per 24 hour  Intake    720 ml  Output      0 ml  Net     720 ml   Filed Weights   11/14/15 1434  Weight: 56.246 kg (124 lb)    Exam:   General:  alert and oriented 2    Cardiovascular: regular rate and rhythm, S1-S2   Respiratory: Clear to auscultation bilaterally    Abdomen: soft, mild distention, nontender, hypoactive bowel sounds    Musculoskeletal: no clubbing or cyanosis or edema     Data Reviewed: Basic Metabolic Panel:  Recent Labs Lab 11/14/15 1553 11/15/15 0632  NA 134* 137  K 2.9* 3.3*  CL 104 110  CO2 22 19*  GLUCOSE 107* 81  BUN 13 10  CREATININE 0.48 0.48  CALCIUM 9.3 8.6*  MG 1.7  --    Liver Function Tests:  Recent Labs Lab 11/14/15 1553 11/15/15 0632  AST 52* 40  ALT 36 25  ALKPHOS 111 74  BILITOT 21.7* 19.5*  PROT 7.6 6.5  ALBUMIN 3.0* 2.4*    Recent Labs Lab 11/14/15 1553  LIPASE 41    Recent Labs Lab 11/14/15 1553  AMMONIA 97*   CBC:  Recent Labs Lab 11/14/15 1553 11/15/15 0632  WBC 11.2* 10.8*  NEUTROABS 8.1*  --   HGB 10.3* 9.0*  HCT 30.6* 26.3*  MCV 101.3* 100.4*  PLT 107* 82*   Cardiac Enzymes:   No results for input(s): CKTOTAL, CKMB, CKMBINDEX, TROPONINI in the last 168 hours. BNP (last 3 results) No results for input(s): BNP in the last 8760 hours.  ProBNP (last 3 results) No  results for input(s): PROBNP in the last 8760 hours.  CBG: No results for input(s): GLUCAP in the last 168 hours.  No results found for this or any previous visit (from the past 240 hour(s)).   Studies: No results found.  Scheduled Meds: . cholecalciferol  2,000 Units Oral Daily  . folic acid  1 mg Oral Daily  . furosemide  20 mg Oral Daily  . lactulose  20 g Oral TID  . multivitamin with minerals  1 tablet Oral Daily  . nicotine  21 mg Transdermal Daily  . predniSONE  10 mg Oral Q breakfast  . thiamine  100 mg Oral Daily  . ursodiol  300 mg Oral BID    Continuous Infusions: . sodium chloride 75 mL/hr at 11/15/15 0644       Time spent:  15 minutes  Hollice EspyKRISHNAN,Miasha Emmons  K  [facility] Pager [phone] . If 7PM-7AM, please contact night-coverage at [email or web address], password [email or web address] 11/15/2015, 4:52 PM  LOS: 1 day

## 2015-11-15 NOTE — Consult Note (Signed)
Consultation Note Date: 11/15/2015   Patient Name: Whitney Roy  DOB: 1961/08/07  MRN: [id number]  Age / Sex: 54 y.o., female   PCP: No Pcp Per Patient Referring Physician: Hollice Espy, MD  Reason for Consultation: Establishing goals of care and Psychosocial/spiritual support  Palliative Care Assessment and Plan Summary of Established Goals of Care and Medical Treatment Preferences   Clinical Assessment/Narrative: Whitney Roy is resting quietly in bed. We talk about her life at home and how she manages her household.  She shares that her friend Whitney Roy helps with hosuehold chores, and her daughter, Whitney Roy stays with her at times.  We talk about changes including falling, (she tells me that she has been falling), sleeping more, (she states she sleep a lot now).  She states she is able to provide her own bathing and dressing, she just goes slowly.  I ask what her doctors have told her about her health, "I forgot".  I share that this is normal because of what is happening.  I share, "It's serious"; she responds "yeah".  I ask if I can share bad news, and she agrees.  I share that her liver problem is bad and there is no way to fix this, that she will not survive her liver failure.  I talk about comfort, dignity and respect.  We also talk about support at home and how Hospice can help her.  We talk about advanced directives, what she does want, and what she doesn't want.  I ask if Whitney Roy has ever thought about or discusses these things and she tells me, "not really".   She asks me, "Am I going to die", and I share that there is no treatment for her liver disease, and that yes, she will not survive this illness.  We talk about length of time (likely less than 6 months, which qualifies her for Hospice in her home.)   We talk about HCPOA.  She would like for Whitney Roy to be her HCPOA. ( She states this on 2 separate occasions). She tells me that Whitney Roy is married and lives with his wife.     Call [facility] at [phone] left VM, call to [phone] no message left.     Contacts/Participants in Discussion: Primary Decision Maker: Whitney Roy is confused at times, but is making her own decisions at this time.  HCPOA: no  Whitney Roy states that she would like for Whitney Roy to be her HCPOA.   Code Status/Advance Care Planning:  FULL at this time  We discuss end of life care, focusing on dignity, respect and symptom relief.   We discuss the role of Hospice.   Symptom Management:   Ativan 1 mg PO/IV Q 6 hours PRN  Zofran 4 mg PO/IV Q 6 hours PRN  Palliative Prophylaxis: None at this time.    Psycho-social/Spiritual:   Support System: Lives alone. Daughter Whitney Roy stays with her some, and friend Whitney Roy helps with groceries, cooking, and housework.   Desire for further Chaplaincy support: Not at this time.   Prognosis: Unable to determine, less than 6 months likely, MELD score of 28.  Discharge Planning:  Undecided, home with [facility] vs Hospice.        Chief Complaint:  Altered mental status History of Present Illness:  Whitney Roy is a 54 y.o. female  This is a 54 year old lady who is known to have cirrhosis of the liver, likely from a combination of alcoholism and possible autoimmune disease  who now presents with altered mental status. When she was seen in the gastroenterology office just over a week ago, it was clear that she has continued alcoholism and that she would not be a candidate for liver transplant. It is also not clear whether she has continued to take her lactulose as prescribed. The caregiver who is present with her at the bedside tells me that he also saw blood in the toilet commode couple of days ago and he was concerned about this too. She denies any hematemesis. However she is confused and she is not able to give me any clear history.   Primary Diagnoses  Present on Admission:  . Hepatic encephalopathy (HCC) . Alcohol use (HCC) .  Cirrhosis, alcoholic (HCC)  Palliative Review of Systems: Whitney Roy denies uncontrolled pain, dyspnea, anxiety or NV.  I have reviewed the medical record, interviewed the patient and family, and examined the patient. The following aspects are pertinent.  Past Medical History  Diagnosis Date  . CHF (congestive heart failure) (HCC)   . GERD (gastroesophageal reflux disease)   . Neuropathy (HCC)   . Anxiety   . Depression   . Liver cirrhosis (HCC)   . Chronic abdominal pain   . ETOH abuse    Social History   Social History  . Marital Status: Single    Spouse Name: N/A  . Number of Children: N/A  . Years of Education: N/A   Social History Main Topics  . Smoking status: Current Some Day Smoker    Types: Cigarettes  . Smokeless tobacco: None  . Alcohol Use: 0.0 oz/week    0 Standard drinks or equivalent per week     Comment: drinks daily  . Drug Use: No  . Sexual Activity: Yes    Birth Control/ Protection: None   Other Topics Concern  . None   Social History Narrative   Family History  Problem Relation Age of Onset  . Colon cancer Father     unsure age of diagnosis  . Liver disease Neg Hx    Scheduled Meds: . cholecalciferol  2,000 Units Oral Daily  . folic acid  1 mg Oral Daily  . furosemide  20 mg Oral Daily  . lactulose  20 g Oral TID  . multivitamin with minerals  1 tablet Oral Daily  . nicotine  21 mg Transdermal Daily  . predniSONE  10 mg Oral Q breakfast  . thiamine  100 mg Oral Daily  . ursodiol  300 mg Oral BID   Continuous Infusions: . sodium chloride 75 mL/hr at 11/15/15 0644   PRN Meds:.LORazepam **OR** LORazepam, ondansetron **OR** ondansetron (ZOFRAN) IV Medications Prior to Admission:  Prior to Admission medications   Medication Sig Start Date End Date Taking? Authorizing Provider  amitriptyline (ELAVIL) 10 MG tablet Take 10 mg by mouth at bedtime.   Yes Historical Provider, MD  Cholecalciferol (VITAMIN D) 2000 UNITS CAPS Take 1 capsule by  mouth daily.   Yes Historical Provider, MD  citalopram (CELEXA) 40 MG tablet Take 40 mg by mouth daily.   Yes Historical Provider, MD  folic acid (FOLVITE) 1 MG tablet Take 1 tablet (1 mg total) by mouth daily. 10/02/15  Yes Standley Brookinganiel P Goodrich, MD  furosemide (LASIX) 20 MG tablet Take 20 mg by mouth daily.   Yes Historical Provider, MD  lactulose (CHRONULAC) 10 GM/15ML solution Take 15ml to 30 ml three to four times a day to have 2-3 soft bowel movements. Patient taking differently: Take 20  g by mouth 3 (three) times daily. have 2-3 soft bowel movements. 08/31/15  Yes Nira Retort, NP  nicotine (NICODERM CQ - DOSED IN MG/24 HOURS) 21 mg/24hr patch Place 1 patch (21 mg total) onto the skin daily. 10/02/15  Yes Standley Brooking, MD  predniSONE (DELTASONE) 10 MG tablet Take 1 tablet (10 mg total) by mouth daily with breakfast. 11/07/15  Yes Nira Retort, NP  ursodiol (URSO) 250 MG tablet Take 1 tablet (250 mg total) by mouth 2 (two) times daily. 08/25/15  Yes Nira Retort, NP  thiamine 100 MG tablet Take 1 tablet (100 mg total) by mouth daily. Patient not taking: Reported on 11/14/2015 10/02/15   Standley Brooking, MD   Allergies  Allergen Reactions  . Tylenol [Acetaminophen] Swelling   CBC:    Component Value Date/Time   WBC 10.8* 11/15/2015 0632   HGB 9.0* 11/15/2015 0632   HCT 26.3* 11/15/2015 0632   PLT 82* 11/15/2015 0632   MCV 100.4* 11/15/2015 0632   NEUTROABS 8.1* 11/14/2015 1553   LYMPHSABS 2.0 11/14/2015 1553   MONOABS 0.8 11/14/2015 1553   EOSABS 0.2 11/14/2015 1553   BASOSABS 0.1 11/14/2015 1553   Comprehensive Metabolic Panel:    Component Value Date/Time   NA 137 11/15/2015 0632   K 3.3* 11/15/2015 0632   CL 110 11/15/2015 0632   CO2 19* 11/15/2015 0632   BUN 10 11/15/2015 0632   CREATININE 0.48 11/15/2015 0632   CREATININE 0.63 08/31/2015 1419   GLUCOSE 81 11/15/2015 0632   CALCIUM 8.6* 11/15/2015 0632   AST 40 11/15/2015 0632   ALT 25 11/15/2015 0632   ALKPHOS 74  11/15/2015 0632   BILITOT 19.5* 11/15/2015 0632   PROT 6.5 11/15/2015 0632   ALBUMIN 2.4* 11/15/2015 1610    Physical Exam: Vital Signs: BP 126/63 mmHg  Pulse 78  Temp(Src) 98.4 F (36.9 C) (Oral)  Resp 20  Ht 5' (1.524 m)  Wt 56.246 kg (124 lb)  BMI 24.22 kg/m2  SpO2 100% SpO2: SpO2: 100 % O2 Device: O2 Device: Not Delivered O2 Flow Rate:   Intake/output summary:  Intake/Output Summary (Last 24 hours) at 11/15/15 1405 Last data filed at 11/15/15 0830  Gross per 24 hour  Intake    720 ml  Output      0 ml  Net    720 ml   LBM:   Baseline Weight: Weight: 56.246 kg (124 lb) Most recent weight: Weight: 56.246 kg (124 lb)  Exam Findings:  Constitutional:  Frail, lying in bed. Makes and keeps eye contact.  Resp: even and non labored.  GI: abd firm distended. Mildly tender.  Psych: mildly confused         Palliative Performance Scale: 30% at this time.              Additional Data Reviewed: Recent Labs     11/14/15  1553  11/15/15  0632  WBC  11.2*  10.8*  HGB  10.3*  9.0*  PLT  107*  82*  NA  134*  137  BUN  13  10  CREATININE  0.48  0.48     Time In: 1030 Time Out: 1120 Time Total:  50 minutes Greater than 50%  of this time was spent counseling and coordinating care related to the above assessment and plan.  Signed by: Katheran Awe, NP  Katheran Awe, NP  11/15/2015, 2:05 PM  Please contact Palliative Medicine Team phone at [phone] for questions  and concerns.

## 2015-11-16 DIAGNOSIS — E876 Hypokalemia: Secondary | ICD-10-CM | POA: Insufficient documentation

## 2015-11-16 DIAGNOSIS — Z789 Other specified health status: Secondary | ICD-10-CM

## 2015-11-16 LAB — COMPREHENSIVE METABOLIC PANEL
ALBUMIN: 2.5 g/dL — AB (ref 3.5–5.0)
ALK PHOS: 93 U/L (ref 38–126)
ALT: 28 U/L (ref 14–54)
AST: 44 U/L — AB (ref 15–41)
Anion gap: 8 (ref 5–15)
BILIRUBIN TOTAL: 20.5 mg/dL — AB (ref 0.3–1.2)
BUN: 7 mg/dL (ref 6–20)
CALCIUM: 8.1 mg/dL — AB (ref 8.9–10.3)
CO2: 19 mmol/L — AB (ref 22–32)
Chloride: 110 mmol/L (ref 101–111)
Creatinine, Ser: 0.32 mg/dL — ABNORMAL LOW (ref 0.44–1.00)
GFR calc Af Amer: >60 mL/min (ref >60)
GFR calc non Af Amer: >60 mL/min (ref >60)
GLUCOSE: 89 mg/dL (ref 65–99)
POTASSIUM: 2.7 mmol/L — AB (ref 3.5–5.1)
SODIUM: 137 mmol/L (ref 135–145)
TOTAL PROTEIN: 6.6 g/dL (ref 6.5–8.1)

## 2015-11-16 LAB — MAGNESIUM: MAGNESIUM: 1.3 mg/dL — AB (ref 1.7–2.4)

## 2015-11-16 LAB — AMMONIA: Ammonia: 56 umol/L — ABNORMAL HIGH (ref 9–35)

## 2015-11-16 LAB — TSH: TSH: 0.651 u[IU]/mL (ref 0.350–4.500)

## 2015-11-16 MED ORDER — POTASSIUM CHLORIDE CRYS ER 20 MEQ PO TBCR
40.0000 meq | EXTENDED_RELEASE_TABLET | ORAL | Status: AC
  Administered 2015-11-16 (×2): 40 meq via ORAL
  Filled 2015-11-16 (×2): qty 2

## 2015-11-16 MED ORDER — PREDNISOLONE 15 MG/5ML PO SOLN
40.0000 mg | Freq: Every day | ORAL | Status: DC
  Administered 2015-11-16 – 2015-11-18 (×3): 40 mg via ORAL
  Filled 2015-11-16 (×6): qty 15

## 2015-11-16 MED ORDER — MAGNESIUM SULFATE 2 GM/50ML IV SOLN
2.0000 g | Freq: Once | INTRAVENOUS | Status: AC
  Administered 2015-11-16: 2 g via INTRAVENOUS
  Filled 2015-11-16: qty 50

## 2015-11-16 NOTE — Progress Notes (Signed)
Daily Progress Note   Patient Name: Whitney Roy       Date: 11/16/2015 DOB: 03-12-1961  Age: 54 y.o. MRN#: [id number] Attending Physician: Osvaldo Shipper, MD Primary Care Physician: No PCP Per Patient Admit Date: 11/14/2015  Reason for Consultation/Follow-up: Establishing goals of care and Psychosocial/spiritual support  Subjective: Whitney Roy is resting quietly in bed today.  Her longtime caregiver/friend Kris Mouton is at bedside.  He tells me that Whitney Roy's mother was his Production designer, theatre/television/film and he promised to look after Whitney Roy when she died.  PT is working with [name] during our conversation, so this allows [place] time (with [place]inah's permission) to discuss her health.  We talk about her chronic health conditions.  [name] tells me that he was hopeful for Whitney Roy to have the 5 months of rehab and get a liver transplant.  I share that this is no longer an option, and we are looking at much less time.  I share (after asking) that we think closer to 2 months.  [name] and I talk about HCPOA, and he shares that he is already her payee for SSI. [name] asks Whitney Roy if this is what she wants, and she states it is.  Unfortunately Whitney Roy is unable to tell me the month, next holiday, or take a piece of paper from my hand, fold in half and lay on bed, nor is she able to repeat no ifs ands or buts.  [name] and I plan to talk with daughter, Whitney Roy tomorrow after 1 pm.   Whitney Roy states that she does not want to go to a SNF.   She tells [name] and I that she is agreeable to Hospice services in her home.   Length of Stay: 2 days  Current Medications: Scheduled Meds:  . cholecalciferol  2,000 Units Oral Daily  . folic acid  1 mg Oral Daily  . furosemide  20 mg Oral Daily  . lactulose  20 g Oral TID  . multivitamin with minerals  1 tablet Oral Daily  . nicotine  21 mg Transdermal Daily  . prednisoLONE  40 mg Oral Daily  . thiamine  100 mg Oral Daily  . ursodiol  300 mg Oral BID    Continuous  Infusions: . sodium chloride 10 mL/hr at 11/16/15 1020    PRN Meds: LORazepam **OR** LORazepam, ondansetron **OR** ondansetron (ZOFRAN) IV, oxyCODONE  Palliative Performance Scale: 40% at best.      Vital Signs: BP 116/62 mmHg  Pulse 71  Temp(Src) 98.5 F (36.9 C) (Oral)  Resp 18  Ht 5' (1.524 m)  Wt 56.246 kg (124 lb)  BMI 24.22 kg/m2  SpO2 98% SpO2: SpO2: 98 % O2 Device: O2 Device: Not Delivered O2 Flow Rate:    Intake/output summary:  Intake/Output Summary (Last 24 hours) at 11/16/15 1641 Last data filed at 11/16/15 1446  Gross per 24 hour  Intake 2379.33 ml  Output    950 ml  Net 1429.33 ml   LBM:   Baseline Weight: Weight: 56.246 kg (124 lb) Most recent weight: Weight: 56.246 kg (124 lb)             Additional Data Reviewed: Recent Labs     11/14/15  1553  11/15/15  0632  11/16/15  0553  WBC  11.2*  10.8*   --   HGB  10.3*  9.0*   --   PLT  107*  82*   --   NA  134*  137  137  BUN  13  10  7   CREATININE  0.48  0.48  0.32*     Problem List:  Patient Active Problem List   Diagnosis Date Noted  . Hypokalemia   . Hypomagnesemia   . Palliative care encounter   . DNR (do not resuscitate) discussion   . Coagulopathy (HCC)   . Hepatic encephalopathy (HCC) 11/14/2015  . Generalized weakness   . Acute encephalopathy 10/01/2015  . CAP (community acquired pneumonia) 09/30/2015  . Alcohol use (HCC) 09/30/2015  . Cirrhosis, alcoholic (HCC) 08/31/2015  . Elevated LFTs 08/16/2015  . Anemia 08/11/2015  . Jaundice of recent onset 08/05/2015  . Disorder of nutrition 06/13/2015     Palliative Care Assessment & Plan    Code Status:  Full code   [name] and I discuss AD and code status but no decisions are made today.   Goals of Care:  Whitney Roy states that she does not want to go to a SNF.   She tells [name] and I that she is agreeable to Hospice services in her home.   Symptom Management:  Ativan 1 mg PO/IV Q 6 hours PRN  Zofran 4 mg PO/IV  Q 6 hours PRN  Palliative Prophylaxis:  None at this time.  Psycho-social/Spiritual:  Desire for further Chaplaincy support:Not at this time.    Prognosis: < 3 months, likely Discharge Planning: Home with Hospice is Whitney Roy's wishes at this time. She has the support of her caregiver Kris MoutonBobby Garland.    Care plan was discussed with CM, SW, and Dr. Rito EhrlichKrishnan.   Thank you for allowing the Palliative Medicine Team to assist in the care of this patient.   Time In: 1440 Time Out: 1510 Total Time 30 minutes Prolonged Time Billed  no     Greater than 50%  of this time was spent counseling and coordinating care related to the above assessment and plan.   Katheran Aweasha A Dove, NP  11/16/2015, 4:41 PM  Please contact Palliative Medicine Team phone at [phone] for questions and concerns.

## 2015-11-16 NOTE — Progress Notes (Signed)
[facility] PROGRESS NOTE  Whitney Roy ZOX:[id number] DOB: 12/19/1960 DOA: 11/14/2015  PCP: No PCP Per Patient  Brief HPI: 54 year old African-American female with a past medical history of alcohol abuse and a history of decompensated liver cirrhosis presented to the hospital with confusion. She was noted to have acute hepatic encephalopathy and was hospitalized for further management.  Past medical history:  Past Medical History  Diagnosis Date  . CHF (congestive heart failure) (HCC)   . GERD (gastroesophageal reflux disease)   . Neuropathy (HCC)   . Anxiety   . Depression   . Liver cirrhosis (HCC)   . Chronic abdominal pain   . ETOH abuse     Consultants: Gastroenterology. Palliative medicine.  Procedures: None  Antibiotics: None  Subjective: Patient feels a little better this morning. Still feels distracted. Denies any pain, shortness of breath. No nausea, vomiting.  Objective: Vital Signs  Filed Vitals:   11/15/15 1329 11/15/15 1914 11/15/15 2353 11/16/15 0622  BP: 126/63 139/45 127/56 111/54  Pulse: 78 80 72 73  Temp: 98.4 F (36.9 C) 98.9 F (37.2 C) 98.8 F (37.1 C) 98.2 F (36.8 C)  TempSrc: Oral Oral Oral Oral  Resp: Height:      Weight:      SpO2: 100% 100% 100% 96%    Intake/Output Summary (Last 24 hours) at 11/16/15 1019 Last data filed at 11/16/15 0604  Gross per 24 hour  Intake   2085 ml  Output    700 ml  Net   1385 ml   Filed Weights   11/14/15 1434  Weight: 56.246 kg (124 lb)    General appearance: alert, cooperative, appears stated age and no distress. Icterus is present Resp: clear to auscultation bilaterally Cardio: regular rate and rhythm, S1, S2 normal, no murmur, click, rub or gallop GI: soft, non-tender; bowel sounds normal; no masses,  no organomegaly Extremities: extremities normal, atraumatic, no cyanosis or edema Neurologic: Asterixis is noted. Alert. Oriented to place, month. No focal  deficits.  Lab Results:  Basic Metabolic Panel:  Recent Labs Lab 11/14/15 1553 11/15/15 0632 11/16/15 0553  NA 134* 137 137  K 2.9* 3.3* 2.7*  CL 104 110 110  CO2 22 19* 19*  GLUCOSE 107* 81 89  BUN CREATININE 0.48 0.48 0.32*  CALCIUM 9.3 8.6* 8.1*  MG 1.7  --   --    Liver Function Tests:  Recent Labs Lab 11/14/15 1553 11/15/15 0632 11/16/15 0553  AST 52* 40 44*  ALT 36 25 28  ALKPHOS 111 74 93  BILITOT 21.7* 19.5* 20.5*  PROT 7.6 6.5 6.6  ALBUMIN 3.0* 2.4* 2.5*    Recent Labs Lab 11/14/15 1553  LIPASE 41    Recent Labs Lab 11/14/15 1553 11/16/15 0553  AMMONIA 97* 56*   CBC:  Recent Labs Lab 11/14/15 1553 11/15/15 0632  WBC 11.2* 10.8*  NEUTROABS 8.1*  --   HGB 10.3* 9.0*  HCT 30.6* 26.3*  MCV 101.3* 100.4*  PLT 107* 82*     Studies/Results: No results found.  Medications:  Scheduled: . cholecalciferol  2,000 Units Oral Daily  . folic acid  1 mg Oral Daily  . furosemide  20 mg Oral Daily  . lactulose  20 g Oral TID  . multivitamin with minerals  1 tablet Oral Daily  . nicotine  21 mg Transdermal Daily  . potassium chloride  40 mEq Oral Q4H  . predniSONE  10 mg Oral  Q breakfast  . thiamine  100 mg Oral Daily  . ursodiol  300 mg Oral BID   Continuous: . sodium chloride 10 mL/hr at 11/16/15 1020   ZOX:[id number]:LORazepam **OR** LORazepam, ondansetron **OR** ondansetron (ZOFRAN) IV, oxyCODONE  Assessment/Plan:  Active Problems:   Cirrhosis, alcoholic (HCC)   Alcohol use (HCC)   Hepatic encephalopathy (HCC)   Palliative care encounter   DNR (do not resuscitate) discussion   Coagulopathy (HCC)    Acute hepatic encephalopathy Patient's mental status is slowly improving. Ammonia level is better. Continue lactulose. GI is following.  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 Patient is not a liver transplant candidate as she continues to drink  alcohol. Palliative medicine has evaluated the patient. There appears to be some degree of denial on part of the patient regarding her condition.  Hypokalemia and hypomagnesemia These will be repleted aggressively.  Macrocytic anemia Hemoglobin has been stable. Continue to monitor. No overt bleeding. Check B-12, folate, TSH.  Alcohol abuse No clear evidence for withdrawal symptoms. Continue CIWA protocol. Continue thiamine and multivitamins.   DVT Prophylaxis: SCDs. Auto Anticoagulated    Code Status: Full code  Family Communication: Discussed with the patient. No family at bedside  Disposition Plan: Await further improvement. Anticipate discharge in the next 1-2 days. She tells me that she lives with her daughter.    LOS: 2 days   [facility] York EndoscopyKRISHNAN,Shaniyah Wix  [facility] Pager [phone] 11/16/2015, 10:19 AM  If 7PM-7AM, please contact night-coverage at [email or web address], password [email or web address]

## 2015-11-16 NOTE — Plan of Care (Signed)
Problem: Acute Rehab PT Goals(only PT should resolve) Goal: Pt Will Ambulate Pt will ambulate with RW at Supervision using a step-through pattern and equal step length for a distances greater than 24400ft to demonstrate the ability to perform safe household distance ambulation at discharge.    Goal: Pt Will Go Up/Down Stairs Pt will ascend/descend 16 stairs with LRAD and 1 HR at Supervision to demonstrate safe entry/exit of sleeping and bathing facilities in home.

## 2015-11-16 NOTE — Progress Notes (Signed)
Subjective:  Complains about headache. No abdominal pain. Ate breakfast. Still somewhat confused.   Objective: Vital signs in last 24 hours: Temp:  [98.2 F (36.8 C)-98.9 F (37.2 C)] 98.2 F (36.8 C) (11/30 0622) Pulse Rate:  [72-80] 73 (11/30 0622) Resp:  [20] 20 (11/30 0622) BP: (111-139)/(45-63) 111/54 mmHg (11/30 0622) SpO2:  [96 %-100 %] 96 % (11/30 0622)   General:   Alert,  jaundice, thin female cooperative in NAD Head:  Normocephalic and atraumatic. Eyes:  Sclera clear, + icterus.  Abdomen:  Soft, nontender and minimal distention.  Normal bowel sounds, without guarding, and without rebound.   Extremities:  Without clubbing, deformity or edema. Neurologic:  Alert and  oriented x 2;  grossly normal neurologically. No asterixis. Skin:  Intact without significant lesions or rashes. Psych:  Alert and cooperative. Normal mood and affect.  Intake/Output from previous day: 11/29 0701 - 11/30 0700 In: 2205 [P.O.:360; I.V.:1845] Out: 700 [Urine:700] Intake/Output this shift:    Lab Results: CBC  Recent Labs  11/14/15 1553 11/15/15 0632  WBC 11.2* 10.8*  HGB 10.3* 9.0*  HCT 30.6* 26.3*  MCV 101.3* 100.4*  PLT 107* 82*   BMET  Recent Labs  11/14/15 1553 11/15/15 0632 11/16/15 0553  NA 134* 137 137  K 2.9* 3.3* 2.7*  CL 104 110 110  CO2 22 19* 19*  GLUCOSE 107* 81 89  BUN 13 10 7   CREATININE 0.48 0.48 0.32*  CALCIUM 9.3 8.6* 8.1*   LFTs  Recent Labs  11/14/15 1553 11/15/15 0632 11/16/15 0553  BILITOT 21.7* 19.5* 20.5*  ALKPHOS 111 74 93  AST 52* 40 44*  ALT 36 25 28  PROT 7.6 6.5 6.6  ALBUMIN 3.0* 2.4* 2.5*    Recent Labs  11/14/15 1553  LIPASE 41   PT/INR  Recent Labs  11/15/15 0632  LABPROT 26.4*  INR 2.47*      Imaging Studies: No results found.[2 weeks]   Assessment:  54 year old female with ETOH cirrhosis, question of autoimmune component, nearing end of life. MELD 28.. Not a candidate for transplantation due to persistent  ETOH use. Low-volume hematochezia likely multifactorial in setting of coagulopathy but resolved now. Chronic anemia without acute blood loss anemia noted. No need for colonoscopy/EGD with grim prognosis. 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 Ammonia improved. Titrate lactulose to 3 soft BMs a day. appreciate palliative care input.  Plan: 1. Lactulose titrated to achieve 3 soft BMs per day 2. May continue prednisone and urso although this will not change clinical course; although could consider prednisolone for possible underlying etoh hepatitis. To discuss with Dr. Jena Gaussourk.  3. Diet as tolerated 4. Palliative care consult highly recommended.  Leanna BattlesLeslie S. Dixon BoosLewis, PA-C [facility] [phone] 11/30/20161:51 PM      LOS: 2 days    Patient seen and examined.  It is quite conceivable that a significant component of her illness is related an element of acute alcoholic hepatitis.. Therefore, may well benefit from prednisolone therapy. Will begin this therapy..Marland Kitchen

## 2015-11-16 NOTE — Evaluation (Signed)
Physical Therapy Evaluation Patient Details Name: Whitney Roy MRN: [id number] DOB: 01/07/1961 Today's Date: 11/16/2015   History of Present Illness  Pt is a 54yo black female WUJ:WJXBJYNWPMH:cirrosis who preseted to the ED on 11/28 with AMS. Pt was admitted and is being treated for hepatic encephalopathy.   Clinical Impression  Pt is received semirecumbent in bed upon entry, awake, lethargic, and willing to participate. Apparent yellowing of sclerae noted upon entry. No acute distress noted. Pt is A&Ox3 and pleasant, but demonstrates intermittent confusion and difficulty answering history questioning due to AMS. Pt demonstrates intermittent delayed response or no response at all to questioning, and has brief periods wherein following simple single-step commands occurs less than 50%. Pt reports abundant falls in the last 6 months. Pt strength is profoundly impaired, requiring MinA to perform all bed mobility and transfers, and only able to tolerate 10 feet ambulation. 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 Pt will benefit from skilled PT services to address the above impairments as well as provide education and falls prevention training to maintain quality of life and comfort, and to prevent additional falls at home. PT recommending DC to STR, c 24/7 assistance for safety.          Follow Up Recommendations SNF;Supervision for mobility/OOB;Supervision/Assistance - 24 hour    Equipment Recommendations  Rolling walker with 5" wheels    Recommendations for Other Services       Precautions / Restrictions Precautions Precautions: None Restrictions Weight Bearing Restrictions: No      Mobility  Bed Mobility Overal bed mobility: Needs Assistance Bed Mobility: Supine to Sit;Sit to Supine     Supine to sit:  Min assist Sit to supine: Min assist      Transfers Overall transfer level: Needs assistance Equipment used: None Transfers: Sit to/from Stand Sit to Stand: Min assist         General transfer comment: very weak, with gross motor ataxia and poor trunk control.  Ambulation/Gait Ambulation/Gait assistance: Min guard Ambulation Distance (Feet): 10 Feet Assistive device: None Gait Pattern/deviations: Wide base of support;Ataxic;Trunk flexed   Gait velocity interpretation: <1.8 ft/sec, indicative of risk for recurrent falls General Gait Details: unsteady and labored, pt unable to go any further; consistent with typical presentation of cerebellar ataxia related to alcohol abuse.   Stairs            Wheelchair Mobility    Modified Rankin (Stroke Patients Only)       Balance Overall balance assessment: No apparent balance deficits (not formally assessed);Needs assistance (not following commands well enough for in depth screening)   Sitting balance-Leahy Scale: Fair       Standing balance-Leahy Scale: Fair                               Pertinent Vitals/Pain Pain Assessment: 0-10 Pain Score: 2  Pain Location: RUQ pain Pain Intervention(s): Limited activity within patient's tolerance;Monitored during session;Premedicated before session    Home Living Family/patient expects to be discharged to:: Private residence Living Arrangements: Alone Available Help at Discharge: Friend(s);Family Type of Home: Apartment Home Access: Stairs to enter   Entergy CorporationEntrance Stairs-Number of Steps: Pt unable to say due to AMS Home Layout: Two level;Bed/bath upstairs Home Equipment: None Additional Comments: daughter lives nearby and assists with IADL, some  ADL regularly.     Prior Function Level of Independence: Needs assistance   Gait / Transfers Assistance Needed: household distances without AD, frequent falls history.   ADL's / Homemaking Assistance Needed: Daughter  provides some assistance, but pt does not go into detail.         Hand Dominance   Dominant Hand: Right    Extremity/Trunk Assessment   Upper Extremity Assessment: Generalized weakness;RUE deficits/detail;LUE deficits/detail RUE Deficits / Details: altered finger to nose, fine motor coordination impairments     LUE Deficits / Details: altered finger to nose, fine motor coordination impairments   Lower Extremity Assessment: Generalized weakness (Will not follow commands for strength testing, requires assistance for bed mobility and transfers, wide based and ataxic gait. )         Communication   Communication: No difficulties  Cognition Arousal/Alertness: Lethargic (Difficulty maintaining eyes open at times. ) Behavior During Therapy: Impulsive;Flat affect (delayed response, off topic response occasionally ) Overall Cognitive Status: Impaired/Different from baseline Area of Impairment: Attention;Following commands;Awareness   Current Attention Level: Selective;Divided   Following Commands: Follows one step commands inconsistently            General Comments      Exercises        Assessment/Plan    PT Assessment Patient needs continued PT services  PT Diagnosis Difficulty walking;Abnormality of gait;Generalized weakness   PT Problem List Decreased strength;Decreased coordination;Decreased mobility;Decreased cognition;Decreased activity tolerance;Decreased balance;Pain  PT Treatment Interventions Gait training;DME instruction;Stair training;Functional mobility training;Therapeutic activities;Therapeutic exercise;Balance training;Neuromuscular re-education;Patient/family education   PT Goals (Current goals can be found in the Care Plan section) Acute Rehab PT Goals Patient Stated Goal: Pt is not immediately interested in STR, wants to go home. Willing to discuss further with palliative.  PT Goal Formulation: With patient/family Time For Goal Achievement:  11/30/15 Potential to Achieve Goals: Fair    Frequency Min 3X/week   Barriers to discharge Inaccessible home environment Lives on second floor of apartment, difficulty with stairs.     Co-evaluation               End of Session Equipment Utilized During Treatment: Gait belt Activity Tolerance: Patient limited by fatigue;Patient tolerated treatment well;Patient limited by lethargy Patient left: in bed;with family/visitor present;with call bell/phone within reach;with bed alarm set;with nursing/sitter in room Nurse Communication: Other (comment)         Time: 1610-9604 PT Time Calculation (min) (ACUTE ONLY): 13 min   Charges:   PT Evaluation $Initial PT Evaluation Tier I: 1 Procedure     PT G Codes:        Hardin Hardenbrook C November 23, 2015, 10:48 PM 10:58 PM  Rosamaria Lints, PT, DPT [place] License # [id number]

## 2015-11-17 DIAGNOSIS — Z8719 Personal history of other diseases of the digestive system: Secondary | ICD-10-CM | POA: Insufficient documentation

## 2015-11-17 DIAGNOSIS — D689 Coagulation defect, unspecified: Secondary | ICD-10-CM

## 2015-11-17 LAB — CBC
HCT: 28.8 % — ABNORMAL LOW (ref 36.0–46.0)
Hemoglobin: 9.8 g/dL — ABNORMAL LOW (ref 12.0–15.0)
MCH: 34.3 pg — AB (ref 26.0–34.0)
MCHC: 34 g/dL (ref 30.0–36.0)
MCV: 100.7 fL — ABNORMAL HIGH (ref 78.0–100.0)
PLATELETS: 103 10*3/uL — AB (ref 150–400)
RBC: 2.86 MIL/uL — AB (ref 3.87–5.11)
RDW: 18.4 % — ABNORMAL HIGH (ref 11.5–15.5)
WBC: 15.4 10*3/uL — ABNORMAL HIGH (ref 4.0–10.5)

## 2015-11-17 LAB — BASIC METABOLIC PANEL
Anion gap: 10 (ref 5–15)
BUN: 7 mg/dL (ref 6–20)
CALCIUM: 8.1 mg/dL — AB (ref 8.9–10.3)
CO2: 18 mmol/L — AB (ref 22–32)
CREATININE: 0.32 mg/dL — AB (ref 0.44–1.00)
Chloride: 106 mmol/L (ref 101–111)
GFR calc non Af Amer: >60 mL/min (ref >60)
GLUCOSE: 151 mg/dL — AB (ref 65–99)
Potassium: 3.2 mmol/L — ABNORMAL LOW (ref 3.5–5.1)
Sodium: 134 mmol/L — ABNORMAL LOW (ref 135–145)

## 2015-11-17 LAB — HEMATOLOGY COMMENTS:

## 2015-11-17 LAB — HEPATIC FUNCTION PANEL
ALT: 36 U/L (ref 14–54)
AST: 64 U/L — AB (ref 15–41)
Albumin: 2.9 g/dL — ABNORMAL LOW (ref 3.5–5.0)
Alkaline Phosphatase: 108 U/L (ref 38–126)
BILIRUBIN DIRECT: 13.1 mg/dL — AB (ref 0.1–0.5)
BILIRUBIN INDIRECT: 11.4 mg/dL — AB (ref 0.3–0.9)
TOTAL PROTEIN: 7.6 g/dL (ref 6.5–8.1)
Total Bilirubin: 24.5 mg/dL (ref 0.3–1.2)

## 2015-11-17 LAB — FOLATE RBC
FOLATE, RBC: 2171 ng/mL (ref >498)
Folate, Hemolysate: 553.5 ng/mL
HEMATOCRIT: 25.5 % — AB (ref 34.0–46.6)

## 2015-11-17 LAB — VITAMIN B12: Vitamin B-12: 2039 pg/mL — ABNORMAL HIGH (ref 180–914)

## 2015-11-17 LAB — MAGNESIUM: Magnesium: 1.8 mg/dL (ref 1.7–2.4)

## 2015-11-17 LAB — AMMONIA: AMMONIA: 69 umol/L — AB (ref 9–35)

## 2015-11-17 MED ORDER — POTASSIUM CHLORIDE CRYS ER 20 MEQ PO TBCR
40.0000 meq | EXTENDED_RELEASE_TABLET | ORAL | Status: AC
  Administered 2015-11-17 (×2): 40 meq via ORAL
  Filled 2015-11-17 (×2): qty 2

## 2015-11-17 NOTE — Progress Notes (Signed)
    Subjective: States she's doing ok today but feels "off kilter." Denies N/V, abdominal pain. Ate breakfast and is keeping food down. No further complaints.  Objective: Vital signs in last 24 hours: Temp:  [98.3 F (36.8 C)-98.5 F (36.9 C)] 98.4 F (36.9 C) (12/01 0645) Pulse Rate:  [71-77] 71 (12/01 0645) Resp:  [18-20] 20 (12/01 0645) BP: (116-143)/(60-65) 127/65 mmHg (12/01 0645) SpO2:  [97 %-100 %] 100 % (12/01 0645) Last BM Date: 11/16/15 General:   Alert and oriented x 2 (some continued confusion), pleasant. Head:  Normocephalic and atraumatic. Eyes:  Positive icterus. Heart:  S1, S2 present, no murmurs noted.  Lungs: Clear to auscultation bilaterally, without wheezing, rales, or rhonchi.  Abdomen:  Bowel sounds present,minimally distended but soft, nontender. No rebound or guarding. No masses appreciated. Jaundiced. Pulses:  Normal bilateral DP pulses noted. Extremities:  Without clubbing or edema. Neurologic:  Alert and  oriented x2;  grossly normal neurologically. Psych:  Alert and cooperative. Normal mood and affect.  Intake/Output from previous day: 11/30 0701 - 12/01 0700 In: 1126.7 [P.O.:680; I.V.:396.7; IV Piggyback:50] Out: 2250 [Urine:2250] Intake/Output this shift: Total I/O In: 240 [P.O.:240] Out: -   Lab Results:  Recent Labs  11/14/15 1553 11/15/15 0632 11/17/15 0659  WBC 11.2* 10.8* 15.4*  HGB 10.3* 9.0* 9.8*  HCT 30.6* 26.3* 28.8*  PLT 107* 82* 103*   BMET  Recent Labs  11/15/15 0632 11/16/15 0553 11/17/15 0659  NA 137 137 134*  K 3.3* 2.7* 3.2*  CL 110 110 106  CO2 19* 19* 18*  GLUCOSE 81 89 151*  BUN 10 7 7   CREATININE 0.48 0.32* 0.32*  CALCIUM 8.6* 8.1* 8.1*   LFT  Recent Labs  11/14/15 1553 11/15/15 0632 11/16/15 0553  PROT 7.6 6.5 6.6  ALBUMIN 3.0* 2.4* 2.5*  AST 52* 40 44*  ALT 36 25 28  ALKPHOS 111 74 93  BILITOT 21.7* 19.5* 20.5*   PT/INR  Recent Labs  11/15/15 0632  LABPROT 26.4*  INR 2.47*    Hepatitis Panel No results for input(s): HEPBSAG, HCVAB, HEPAIGM, HEPBIGM in the last 72 hours.   Studies/Results: No results found.  Assessment: 54 year old female with ETOH cirrhosis, question of autoimmune component, nearing end of life. MELD 28.. Not a candidate for transplantation due to persistent ETOH use. Low-volume hematochezia likely multifactorial in setting of coagulopathy but resolved now. Chronic anemia without acute blood loss anemia noted. No need for colonoscopy/EGD with grim prognosis. 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 Ammonia improved. Titrate lactulose to 3 soft BMs a day.  Decision made yesterday to start prednisolone for likely significant componant of acute alcoholic hepatitis. Hgb improved today to 9.8, platelets improved to 103 as well. Ammonia elevated at 69. Only BMP ordered today. CMP scheduled for tomorrow. Appreciate palliative care consult and input. Last bowel movement last night approx 10 pm, soft.    Plan: 1. Add HFP to labs today 2. Continue supportive measures 3. Continue prednisolone 4. Continue lactulose for 3 soft bowel movements a day 5. Follow-up on palliative care discussion with patient's daughter today regarding wishes   Wynne DustEric Gill, AGNP-C Adult & Gerontological Nurse Practitioner [facility]    LOS: 3 days    11/17/2015, 9:47 AM

## 2015-11-17 NOTE — Progress Notes (Signed)
[facility] PROGRESS NOTE  Whitney Roy ZOX:[id number] DOB: 01/21/1961 DOA: 11/14/2015  PCP: No PCP Per Patient  Brief HPI: 54 year old African-American female with a past medical history of alcohol abuse and a history of decompensated liver cirrhosis presented to the hospital with confusion. She was noted to have acute hepatic encephalopathy and was hospitalized for further management.  Past medical history:  Past Medical History  Diagnosis Date  . CHF (congestive heart failure) (HCC)   . GERD (gastroesophageal reflux disease)   . Neuropathy (HCC)   . Anxiety   . Depression   . Liver cirrhosis (HCC)   . Chronic abdominal pain   . ETOH abuse     Consultants: Gastroenterology. Palliative medicine.  Procedures: None  Antibiotics: None  Subjective: Patient denies any complaints. Tolerating her diet. No nausea, vomiting.   Objective: Vital Signs  Filed Vitals:   11/16/15 1200 11/16/15 1810 11/16/15 2228 11/17/15 0645  BP: 116/62 129/60 143/62 127/65  Pulse: 71 75 77 71  Temp: 98.5 F (36.9 C) 98.3 F (36.8 C) 98.4 F (36.9 C) 98.4 F (36.9 C)  TempSrc:  Oral Oral Oral  Resp: Height:      Weight:      SpO2: 98% 100% 100% 100%    Intake/Output Summary (Last 24 hours) at 11/17/15 0933 Last data filed at 11/17/15 0900  Gross per 24 hour  Intake 1166.66 ml  Output   2250 ml  Net -1083.34 ml   Filed Weights   11/14/15 1434  Weight: 56.246 kg (124 lb)    General appearance: alert, cooperative, appears stated age and no distress. Icterus is present Resp: clear to auscultation bilaterally Cardio: regular rate and rhythm, S1, S2 normal, no murmur, click, rub or gallop GI: soft, non-tender; bowel sounds normal; no masses,  no organomegaly Extremities: extremities normal, atraumatic, no cyanosis or edema Neurologic: Asterixis is improved. Alert. Oriented to place, person. No focal deficits. Less distracted today.  Lab Results:  Basic  Metabolic Panel:  Recent Labs Lab 11/14/15 1553 11/15/15 0632 11/16/15 0553 11/17/15 0659  NA 134* 137 137 134*  K 2.9* 3.3* 2.7* 3.2*  CL 104 110 110 106  CO2 22 19* 19* 18*  GLUCOSE 107* 81 89 151*  BUN CREATININE 0.48 0.48 0.32* 0.32*  CALCIUM 9.3 8.6* 8.1* 8.1*  MG 1.7  --  1.3* 1.8   Liver Function Tests:  Recent Labs Lab 11/14/15 1553 11/15/15 0632 11/16/15 0553  AST 52* 40 44*  ALT 36 25 28  ALKPHOS 111 74 93  BILITOT 21.7* 19.5* 20.5*  PROT 7.6 6.5 6.6  ALBUMIN 3.0* 2.4* 2.5*    Recent Labs Lab 11/14/15 1553  LIPASE 41    Recent Labs Lab 11/14/15 1553 11/16/15 0553 11/17/15 0703  AMMONIA 97* 56* 69*   CBC:  Recent Labs Lab 11/14/15 1553 11/15/15 0632 11/17/15 0659  WBC 11.2* 10.8* 15.4*  NEUTROABS 8.1*  --   --   HGB 10.3* 9.0* 9.8*  HCT 30.6* 26.3* 28.8*  MCV 101.3* 100.4* 100.7*  PLT 107* 82* 103*     Studies/Results: No results found.  Medications:  Scheduled: . cholecalciferol  2,000 Units Oral Daily  . folic acid  1 mg Oral Daily  . furosemide  20 mg Oral Daily  . lactulose  20 g Oral TID  . multivitamin with minerals  1 tablet Oral Daily  . nicotine  21 mg Transdermal Daily  . prednisoLONE  40 mg Oral Daily  . thiamine  100 mg Oral Daily  . ursodiol  300 mg Oral BID   Continuous: . sodium chloride 10 mL/hr at 11/16/15 1020   ZOX:[id number]:LORazepam **OR** LORazepam, ondansetron **OR** ondansetron (ZOFRAN) IV, oxyCODONE  Assessment/Plan:  Active Problems:   Cirrhosis, alcoholic (HCC)   Alcohol use (HCC)   Hepatic encephalopathy (HCC)   Palliative care encounter   DNR (do not resuscitate) discussion   Coagulopathy (HCC)   Hypokalemia   Hypomagnesemia    Acute hepatic encephalopathy Patient's mental status is slowly improving. Ammonia level remains elevated. Continue lactulose. GI is following.  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 Patient is not a liver transplant candidate as she continues to drink alcohol. Palliative medicine has evaluated the patient. There appears to be some degree of denial on part of the patient regarding her condition. Palliative medicine to discuss with patient's daughter later today regarding transition to hospice.  Hypokalemia and hypomagnesemia Improved. Supplements some more today.  Macrocytic anemia Hemoglobin has been stable. Continue to monitor. No overt bleeding. B-12 and TSH normal. Folate is pending.  Alcohol abuse No clear evidence for withdrawal symptoms. Continue CIWA protocol. Continue thiamine and multivitamins.   DVT Prophylaxis: SCDs. Auto Anticoagulated    Code Status: Full code  Family Communication: Discussed with the patient. No family at bedside  Disposition Plan: Await further improvement. Await discussion by palliative medicine with patient's daughter today. Possible discharge home tomorrow with hospice if family is agreeable.    LOS: 3 days   [facility] LLCKRISHNAN,Carleigh Buccieri  [facility] Pager [phone] 11/17/2015, 9:33 AM  If 7PM-7AM, please contact night-coverage at [email or web address], password [email or web address]

## 2015-11-17 NOTE — Evaluation (Signed)
Occupational Therapy Evaluation Patient Details Name: Whitney Roy MRN: [id number] DOB: 02/23/1961 Today's Date: 11/17/2015    History of Present Illness Pt is a 54yo black female WUJ:WJXBJYNWPMH:cirrosis who preseted to the ED on 11/28 with AMS. Pt was admitted and is being treated for hepatic encephalopathy.    Clinical Impression   Pt awake, alert, and oriented to person and place this am. Pt requires supervision during ADL tasks this am due to impulsiveness and difficulty problem solving at times. Pt reports her daughter lives next door and stays with her sometimes. Pt has daughter and friend who check on her and assist with ADL tasks as necessary. Her friend pays her bills and runs errands for her. Pt is not interested in SNF, stating "I want to go home." Palliative care spoke with pt yesterday and per chart review pt is considering hospice care on discharge. When questioned during evaluation pt states "yes I want someone to come to my home", pt otherwise has no comments. Pt would benefit from supervision at home for safety during ADL tasks. No further acute OT services required at this time.     Follow Up Recommendations  No OT follow up;Supervision/Assistance - 24 hour (Per chart review: pt considering hospice care)    Equipment Recommendations  None recommended by OT       Precautions / Restrictions Precautions Precautions: Fall Restrictions Weight Bearing Restrictions: No      Mobility Bed Mobility Overal bed mobility: Modified Independent                Transfers Overall transfer level: Needs assistance Equipment used: None Transfers: Sit to/from Stand Sit to Stand: Supervision         General transfer comment: impulsive         ADL Overall ADL's : Needs assistance/impaired Eating/Feeding: Supervision/ safety;Cueing for sequencing;Sitting                   Lower Body Dressing: Supervision/safety;Sitting/lateral leans                                Pertinent Vitals/Pain Pain Assessment: No/denies pain     Hand Dominance Right   Extremity/Trunk Assessment Upper Extremity Assessment Upper Extremity Assessment: Overall WFL for tasks assessed   Lower Extremity Assessment Lower Extremity Assessment: Defer to PT evaluation       Communication Communication Communication: No difficulties   Cognition Arousal/Alertness: Awake/alert Behavior During Therapy: Flat affect Overall Cognitive Status: Impaired/Different from baseline Area of Impairment: Awareness;Problem solving   Current Attention Level: Selective   Following Commands: Follows one step commands inconsistently     Problem Solving: Slow processing;Requires verbal cues                Home Living Family/patient expects to be discharged to:: Private residence Living Arrangements: Alone Available Help at Discharge: Friend(s);Family               Bathroom Shower/Tub: Chief Strategy OfficerTub/shower unit   Bathroom Toilet: Standard     Home Equipment: Environmental consultantWalker - 2 wheels   Additional Comments: daughter lives nearby and assists with IADL, some ADL regularly.       Prior Functioning/Environment Level of Independence: Needs assistance  Gait / Transfers Assistance Needed: household distances without AD, frequent falls history.  ADL's / Homemaking Assistance Needed: Daughter and friend, [name], assist with household tasks and errands Communication / Swallowing Assistance Needed: WNL.  OT Diagnosis: Cognitive deficits   OT Problem List: Decreased cognition   OT Treatment/Interventions:      OT Goals(Current goals can be found in the care plan section) Acute Rehab OT Goals Patient Stated Goal: Pt is not interested in SNF, wants to go home and is considering hospice care  OT Frequency:      End of Session    Activity Tolerance: Patient tolerated treatment well Patient left: in bed;with call bell/phone within reach;with bed alarm set   Time: 0821-0901 OT  Time Calculation (min): 40 min Charges:  OT General Charges $OT Visit: 1 Procedure OT Evaluation $Initial OT Evaluation Tier I: 1 Procedure   Ezra Sites, OTR/L  [phone]  11/17/2015, 9:01 AM

## 2015-11-17 NOTE — Progress Notes (Signed)
Patient pulled IV out. No IV medications ordered. Patient plan to be discharge home with hospice tomorrow. Notified Dr. Rito EhrlichKrishnan, new orders to leave IV out.

## 2015-11-17 NOTE — Progress Notes (Signed)
CRITICAL VALUE ALERT  Critical value received:  24.5 Total Bilirubin  Date of notification:  11/17/2015  Time of notification:  11:40AM  Nurse who received alert:  Darvin Neighbourshristina Knight   MD notified: Dr. Rito EhrlichKrishnan   Awaiting response of MD

## 2015-11-17 NOTE — Progress Notes (Signed)
Daily Progress Note   Patient Name: Whitney Roy       Date: 11/17/2015 DOB: 10/23/1961  Age: 54 y.o. MRN#: [id number] Attending Physician: Osvaldo ShipperGokul Krishnan, MD Primary Care Physician: No PCP Per Patient Admit Date: 11/14/2015  Reason for Consultation/Follow-up: Establishing goals of care and Psychosocial/spiritual support  Subjective: Whitney Roy is resting quietly in the bed today. She tells me that she has fallen earlier today. She denies pain or other symptoms today.  We talk about Hospice again and she tells me that she wants to go home and be comfortable.  She tells me that her daughter is not coming today but I can call her.   Call to Whitney FullingErica Williams at [phone] and she tells me that she will be at the hospital soon.  Arriving at room, Whitney Roy and [place]le[place]rica are outside of room and ask to meet and talk about issues privately.   We go to my office and talk about Whitney Roy's chronic illness.  [name] shares that he was hopefull that she would improve and possibly get a liver. We talk about her lab values, her MELD score.  We talk about dignity and the concept of allow a natural death.  Whitney Roy is tearful and shares that she lost her father at age 54.  We talk about what is important to Whitney Roy, and they state they have discussed the option of Hospice and would like this support for Whitney Roy.  We talk about this process, and the medications, equipment and overall support they  will receive.  They tell me they would like [facility].   Please contact daughter Whitney Fullingrica Williams at [phone] Caregiver Whitney Roy [phone] (family friend)  Length of Stay: 3 days  Current Medications: Scheduled Meds:  . cholecalciferol  2,000 Units Oral Daily  . folic acid  1 mg Oral Daily  . furosemide  20 mg Oral Daily  . lactulose  20 g Oral TID  . multivitamin with minerals  1 tablet Oral Daily  . nicotine  21 mg Transdermal Daily  . prednisoLONE  40 mg Oral  Daily  . thiamine  100 mg Oral Daily  . ursodiol  300 mg Oral BID    Continuous Infusions: . sodium chloride 10 mL/hr at 11/16/15 1020    PRN Meds: LORazepam **OR** LORazepam, ondansetron **OR** ondansetron (ZOFRAN) IV, oxyCODONE  Palliative Performance Scale: 40% at this time.      Vital Signs: BP 148/65 mmHg  Pulse 73  Temp(Src) 98.2 F (36.8 C) (Oral)  Resp 18  Ht 5' (1.524 m)  Wt 56.246 kg (124 lb)  BMI 24.22 kg/m2  SpO2 100% SpO2: SpO2: 100 % O2 Device: O2 Device: Not Delivered O2 Flow Rate:    Intake/output summary:  Intake/Output Summary (Last 24 hours) at 11/17/15 1519 Last data filed at 11/17/15 1200  Gross per 24 hour  Intake 752.33 ml  Output   2000 ml  Net -1247.67 ml   LBM:  11/16/15 Baseline Weight: Weight: 56.246 kg (124 lb) Most recent weight: Weight: 56.246 kg (124 lb)               Additional Data Reviewed: Recent Labs     11/15/15  0632  11/16/15  0553  11/17/15  0659  WBC  10.8*   --   15.4*  HGB  9.0*   --   9.8*  PLT  82*   --   103*  NA  137  137  134*  BUN  CREATININE  0.48  0.32*  0.32*     Problem List:  Patient Active Problem List   Diagnosis Date Noted  . History of cirrhosis   . Hypokalemia   . Hypomagnesemia   . Palliative care encounter   . DNR (do not resuscitate) discussion   . Coagulopathy (HCC)   . Hepatic encephalopathy (HCC) 11/14/2015  . Generalized weakness   . Acute encephalopathy 10/01/2015  . CAP (community acquired pneumonia) 09/30/2015  . Alcohol use (HCC) 09/30/2015  . Cirrhosis, alcoholic (HCC) 08/31/2015  . Elevated LFTs 08/16/2015  . Anemia 08/11/2015  . Jaundice of recent onset 08/05/2015  . Disorder of nutrition 06/13/2015     Palliative Care Assessment & Plan    Code Status:  DNR  Goals of Care:  Home with [facility]   Symptom Management:  Ativan 1 mg PO/IV Q 6 hours PRN  Zofran 4 mg PO/IV Q 6 hours PRN  Palliative Prophylaxis:  Lactulose  TID   Psycho-social/Spiritual:  Desire for further Chaplaincy support:no   Prognosis: < 3 months likely  Discharge Planning: Home with [facility] plan was discussed with nursing staff, CM, SW, and Dr. Rito Ehrlich.   Thank you for allowing the Palliative Medicine Team to assist in the care of this patient.   Time In: 1410 Time Out: 1510 Total Time 60 minutes Prolonged Time Billed  no     Greater than 50%  of this time was spent counseling and coordinating care related to the above assessment and plan.   Katheran Awe, NP  11/17/2015, 3:19 PM  Please contact Palliative Medicine Team phone at [phone] for questions and concerns.

## 2015-11-18 DIAGNOSIS — K7031 Alcoholic cirrhosis of liver with ascites: Secondary | ICD-10-CM

## 2015-11-18 LAB — CBC
HCT: 31.2 % — ABNORMAL LOW (ref 36.0–46.0)
HEMOGLOBIN: 10.5 g/dL — AB (ref 12.0–15.0)
MCH: 34.1 pg — AB (ref 26.0–34.0)
MCHC: 33.7 g/dL (ref 30.0–36.0)
MCV: 101.3 fL — AB (ref 78.0–100.0)
Platelets: 113 10*3/uL — ABNORMAL LOW (ref 150–400)
RBC: 3.08 MIL/uL — AB (ref 3.87–5.11)
RDW: 18.9 % — ABNORMAL HIGH (ref 11.5–15.5)
WBC: 16.7 10*3/uL — AB (ref 4.0–10.5)

## 2015-11-18 LAB — AMMONIA: Ammonia: 53 umol/L — ABNORMAL HIGH (ref 9–35)

## 2015-11-18 LAB — COMPREHENSIVE METABOLIC PANEL
ALK PHOS: 128 U/L — AB (ref 38–126)
ALT: 38 U/L (ref 14–54)
AST: 59 U/L — AB (ref 15–41)
Albumin: 2.7 g/dL — ABNORMAL LOW (ref 3.5–5.0)
Anion gap: 8 (ref 5–15)
BUN: 9 mg/dL (ref 6–20)
CALCIUM: 8.5 mg/dL — AB (ref 8.9–10.3)
CO2: 19 mmol/L — AB (ref 22–32)
CREATININE: 0.36 mg/dL — AB (ref 0.44–1.00)
Chloride: 111 mmol/L (ref 101–111)
GFR calc non Af Amer: >60 mL/min (ref >60)
GLUCOSE: 102 mg/dL — AB (ref 65–99)
Potassium: 3.5 mmol/L (ref 3.5–5.1)
SODIUM: 138 mmol/L (ref 135–145)
Total Bilirubin: 22 mg/dL (ref 0.3–1.2)
Total Protein: 7.1 g/dL (ref 6.5–8.1)

## 2015-11-18 LAB — MAGNESIUM: Magnesium: 1.7 mg/dL (ref 1.7–2.4)

## 2015-11-18 MED ORDER — OXYCODONE HCL 5 MG PO TABS: 5.0000 mg | ORAL_TABLET | Freq: Three times a day (TID) | ORAL | Status: AC | PRN

## 2015-11-18 MED ORDER — LORAZEPAM 0.5 MG PO TABS: 0.5000 mg | ORAL_TABLET | Freq: Four times a day (QID) | ORAL | Status: AC | PRN

## 2015-11-18 MED ORDER — PREDNISOLONE 15 MG/5ML PO SOLN: 40.0000 mg | Freq: Every day | ORAL | Status: AC

## 2015-11-18 NOTE — Discharge Summary (Signed)
[facility]  Physician Discharge Summary   Patient ID: Whitney Roy MRN: [id number] DOB/AGE: [birth year] 54 y.o.  Admit date: 11/14/2015 Discharge date: 11/18/2015  PCP: No PCP Per Patient  DISCHARGE DIAGNOSES:  Active Problems:   Cirrhosis, alcoholic (HCC)   Alcohol use (HCC)   Hepatic encephalopathy (HCC)   Palliative care encounter   DNR (do not resuscitate) discussion   Coagulopathy (HCC)   Hypokalemia   Hypomagnesemia   History of cirrhosis   RECOMMENDATIONS FOR OUTPATIENT FOLLOW UP: 1. Patient going home with hospice services   DISCHARGE CONDITION: stable  Diet recommendation: Low sodium  Filed Weights   11/14/15 1434  Weight: 56.246 kg (124 lb)    INITIAL HISTORY: 54 year old African-American female with a past medical history of alcohol abuse and a history of decompensated liver cirrhosis presented to the hospital with confusion. She was noted to have acute hepatic encephalopathy and was hospitalized for further management.  Consultations:  Gastroenterology and palliative medicine   HOSPITAL COURSE:   Acute hepatic encephalopathy Patient was admitted to the hospital with confusion. She was noted to have elevated ammonia level. She was given lactose. Patient's mental status started improving. However, she does have advanced liver cirrhosis with thrombocytopenia and coagulopathy. This is not amenable to any treatment. Please see below.  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 Patient is not a liver transplant candidate as she continues to drink alcohol. 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 Gastroenterology recommended palliative medicine input. She was also started on prednisolone. This will be continued for now. This was obtained. Discussions were held with the patient and her family. She is thought to be appropriate for hospice services. This will be  arranged in the home setting. Patient's prognosis is poor, likely few weeks to 2-3 months life expectancy.  Hypokalemia and hypomagnesemia Improved.   Macrocytic anemia Hemoglobin has been stable. No overt bleeding. B-12 and TSH normal. Folate is normal.  Alcohol abuse No clear evidence for withdrawal symptoms. She was placed on CIWA protocol. She was given thiamine and multivitamins.  Patient is now DO NOT RESUSCITATE. She'll be discharged home with hospice services.   PERTINENT LABS:  The results of significant diagnostics from this hospitalization (including imaging, microbiology, ancillary and laboratory) are listed below for reference.     Labs: Basic Metabolic Panel:  Recent Labs Lab 11/14/15 1553 11/15/15 0632 11/16/15 0553 11/17/15 0659 11/18/15 0640  NA 134* 137 137 134* 138  K 2.9* 3.3* 2.7* 3.2* 3.5  CL 104 110 110 106 111  CO2 22 19* 19* 18* 19*  GLUCOSE 107* 81 89 151* 102*  BUN CREATININE 0.48 0.48 0.32* 0.32* 0.36*  CALCIUM 9.3 8.6* 8.1* 8.1* 8.5*  MG 1.7  --  1.3* 1.8 1.7   Liver Function Tests:  Recent Labs Lab 11/14/15 1553 11/15/15 0632 11/16/15 0553 11/17/15 0659 11/18/15 0640  AST 52* 40 44* 64* 59*  ALT 36 25 28 36 38  ALKPHOS 111 74 93 108 128*  BILITOT 21.7* 19.5* 20.5* 24.5* 22.0*  PROT 7.6 6.5 6.6 7.6 7.1  ALBUMIN 3.0* 2.4* 2.5* 2.9* 2.7*    Recent Labs Lab 11/14/15 1553  LIPASE 41    Recent Labs Lab 11/14/15 1553 11/16/15 0553 11/17/15 0703 11/18/15 0640  AMMONIA 97* 56* 69* 53*   CBC:  Recent Labs Lab 11/14/15 1553 11/15/15 0632 11/16/15 1542 11/17/15 0659 11/18/15 0640  WBC 11.2* 10.8*  --  15.4* 16.7*  NEUTROABS 8.1*  --   --   --   --   HGB 10.3* 9.0*  --  9.8* 10.5*  HCT 30.6* 26.3* 25.5* 28.8* 31.2*  MCV 101.3* 100.4*  --  100.7* 101.3*  PLT 107* 82*  --  103* 113*     DISCHARGE EXAMINATION: Filed Vitals:   11/17/15 1207 11/17/15 1442 11/18/15 0401 11/18/15 0921  BP:  148/65 133/66    Pulse:  73 91   Temp:  98.2 F (36.8 C) 98.5 F (36.9 C)   TempSrc:   Oral   Resp:  18 20   Height:      Weight:      SpO2: 96% 100% 100% 99%   General appearance: alert, distracted and no distress Resp: clear to auscultation bilaterally Cardio: regular rate and rhythm, S1, S2 normal, no murmur, click, rub or gallop GI: soft, non-tender; bowel sounds normal; no masses,  no organomegaly Neurologic: Alert. Distracted. Disoriented. Asterixis was present  DISPOSITION: Home with hospice  Discharge Instructions    Diet - low sodium heart healthy    Complete by:  As directed      Discharge instructions    Complete by:  As directed   You were cared for by a hospitalist during your hospital stay. If you have any questions about your discharge medications or the care you received while you were in the hospital after you are discharged, you can call the unit and asked to speak with the hospitalist on call if the hospitalist that took care of you is not available. Once you are discharged, your primary care physician will handle any further medical issues. Please note that NO REFILLS for any discharge medications will be authorized once you are discharged, as it is imperative that you return to your primary care physician (or establish a relationship with a primary care physician if you do not have one) for your aftercare needs so that they can reassess your need for medications and monitor your lab values. If you do not have a primary care physician, you can call [phone] for a physician referral.     Increase activity slowly    Complete by:  As directed            ALLERGIES:  Allergies  Allergen Reactions  . Tylenol [Acetaminophen] Swelling     Discharge Medication List as of 11/18/2015 12:51 PM    START taking these medications   Details  LORazepam (ATIVAN) 0.5 MG tablet Take 1-2 tablets (0.5-1 mg total) by mouth every 6 (six) hours as needed for anxiety., Starting 11/18/2015, Until  Discontinued, Print    oxyCODONE (OXY IR/ROXICODONE) 5 MG immediate release tablet Take 1 tablet (5 mg total) by mouth every 8 (eight) hours as needed for moderate pain., Starting 11/18/2015, Until Discontinued, Print    prednisoLONE (PRELONE) 15 MG/5ML SOLN Take 13.3 mLs (40 mg total) by mouth daily., Starting 11/18/2015, Until Discontinued, Print      CONTINUE these medications which have NOT CHANGED   Details  Cholecalciferol (VITAMIN D) 2000 UNITS CAPS Take 1 capsule by mouth daily., Until Discontinued, Historical Med    citalopram (CELEXA) 40 MG tablet Take 40 mg by mouth daily., Until Discontinued, Historical Med    folic acid (FOLVITE) 1 MG tablet Take 1 tablet (1 mg total) by mouth daily., Starting 10/02/2015, Until Discontinued, No Print    furosemide (LASIX) 20 MG tablet Take 20 mg by mouth daily., Until Discontinued, Historical Med  lactulose (CHRONULAC) 10 GM/15ML solution Take 15ml to 30 ml three to four times a day to have 2-3 soft bowel movements., Normal    nicotine (NICODERM CQ - DOSED IN MG/24 HOURS) 21 mg/24hr patch Place 1 patch (21 mg total) onto the skin daily., Starting 10/02/2015, Until Discontinued, Normal    ursodiol (URSO) 250 MG tablet Take 1 tablet (250 mg total) by mouth 2 (two) times daily., Starting 08/25/2015, Until Discontinued, Print    thiamine 100 MG tablet Take 1 tablet (100 mg total) by mouth daily., Starting 10/02/2015, Until Discontinued, No Print      STOP taking these medications     amitriptyline (ELAVIL) 10 MG tablet      predniSONE (DELTASONE) 10 MG tablet          TOTAL DISCHARGE TIME: 35 minutes  [facility]  [facility] Pager [phone]  11/18/2015, 1:25 PM

## 2015-11-18 NOTE — Progress Notes (Signed)
PT Cancellation Note  Patient Details Name: Whitney Roy MRN: [id number] DOB: 02/22/1961   Cancelled Treatment:    Reason Eval/Treat Not Completed: Other (comment).  Per case management, pt will be transitioning to Hospice.  We will d/c PT service at this time.   Myrlene BrokerBrown, Adalae Baysinger L  PT 11/18/2015, 10:34 AM [phone]

## 2015-11-18 NOTE — Care Management Note (Signed)
Case Management Note  Patient Details  Name: Lollie Sailsngela Vari MRN: [id number] Date of Birth: 01/04/1961  Subjective/Objective:                    Action/Plan:   Expected Discharge Date:  11/16/15               Expected Discharge Plan:  Home w Hospice Care  In-House Referral:  Hospice / Palliative Care  Discharge planning Services  CM Consult  Post Acute Care Choice:  Hospice Choice offered to:  Adult Children  DME Arranged:    DME Agency:     HH Arranged:    HH Agency:     Status of Service:  Completed, signed off  Medicare Important Message Given:    Date Medicare IM Given:    Medicare IM give by:    Date Additional Medicare IM Given:    Additional Medicare Important Message give by:     If discussed at Long Length of Stay Meetings, dates discussed:    Additional Comments: Pt [facility]. Pt, pts daughter, pts caregiver, and Palliative Care NP discussed GOC and pts prognosis and the pt and pts daughter elected to [facility]. Pts daughter stated that pt will be coming to live with her at discharge. Referral called and faxed and pt has been accepted. Hospice is aware that pt does not have a PCP and [facility] will [facility] and sign orders. Pt does not have any DME needs at this time. Pres[facility] to [facility]. Pts caregiver, [name], will take pt to pts daughter home at discharge. Pt and pts nurse aware of discharge arrangements. Arlyss QueenBlackwell, Tarrell Debes Joycerowder, RN 11/18/2015, 1:21 PM

## 2015-11-18 NOTE — Final Progress Note (Addendum)
Patient discharged with instructions, prescription, and care notes.  Verbalized understanding via teach back.  IV was removed and the site was WNL. Patient voiced no further complaints or concerns at the time of discharge.  Appointments scheduled per instructions.  Patient left the floor via w/c with staff and family in stable condition.  Patient caregiver was given hard copy prescriptions and yellow stop sign form.

## 2015-11-18 NOTE — Discharge Instructions (Signed)
Hepatic Encephalopathy Hepatic encephalopathy is a loss of brain function from advanced liver disease. The effects of the condition depend on the type of liver damage and how severe it is. In some cases, hepatic encephalopathy can be reversed. CAUSES The exact cause of hepatic encephalopathy is not known. RISK FACTORS You have a higher risk of getting this condition if your liver is damaged. When the liver is damaged harmful substances called toxins can build up in the body. Certain toxins, such as ammonia, can harm your brain. Conditions that can cause liver damage include:  An infection.  Dehydration.  Intestinal bleeding.  Drinking too much alcohol.  Taking certain medicines, including tranquilizers, water pills (diuretics), antidepressants, or sleeping pills. SIGNS AND SYMPTOMS Signs and symptoms may develop suddenly. Or, they may develop slowly and get worse gradually. Symptoms can range from mild to severe. Mild Hepatic Encephalopathy  Mild confusion.  Personality and mood changes.  Anxiety and agitation.  Drowsiness.  Loss of mental abilities.  Musty or sweet-smelling breath. Worsening or Severe Hepatic Encephalopathy  Slowed movement.  Slurred speech.  Extreme personality changes.  Disorientation.  Abnormal shaking or flapping of the hands.  Coma. DIAGNOSIS To make a diagnosis, your health care provider will do a physical exam. To rule out other causes of your signs and symptoms, he or she may order tests. You may have:  Blood tests. These may be done to check your ammonia level, measure how long it takes your blood to clot, and check for infection.  Liver function tests. These may be done to check how well your liver is working.  MRI and CT scans. These may be done to check for a brain disorder.  Electroencephalogram (EEG). This may be done to measure the electrical activity in your brain. TREATMENT The first step in treatment is identifying and  treating possible triggers. The next step is involves taking medicine to lower the level of toxins in the body and to prevent ammonia from building up. You may need to take:  Antibiotics to reduce the ammonia-producing bacteria in your gut.  Lactulose to help flush ammonia from the gut. HOME CARE INSTRUCTIONS Eating and Drinking  Follow a low-protein diet that includes plenty of fruits, vegetables, and whole grains, as directed by your health care provider. Ammonia is produced when you digest high-protein foods.  Work with a dietitian or with your health care provider to make sure you are getting the right balance of protein and minerals.  Drink enough fluids to keep your urine clear or pale yellow. Drinking plenty of water helps prevent constipation.  Do not drink alcohol or use illegal drugs. Medicines  Only take medicine as directed by your health care provider.  If you were prescribed an antibiotic medicine, finish it all even if you start to feel better.  Do not start any new medicines, including over-the-counter medicines, without first checking with your health care provider. SEEK MEDICAL CARE IF:  You have new symptoms.  Your symptoms change.  Your symptoms get worse.  You have a fever.  You are constipated.  You have persistent nausea, vomiting, or diarrhea. SEEK IMMEDIATE MEDICAL CARE IF:  You become very confused or drowsy.  You vomit blood or material that looks like coffee grounds.  Your stool is bloody or black or looks like tar.   This information is not intended to replace advice given to you by your health care provider. Make sure you discuss any questions you have with your health care provider.     Document Released: 02/12/2007 Document Revised: 12/24/2014 Document Reviewed: 07/21/2014 Elsevier Interactive Patient Education 2016 [employer]  

## 2015-12-18 DEATH — deceased

## 2016-02-13 ENCOUNTER — Telehealth: Payer: Self-pay | Admitting: Gastroenterology

## 2016-02-13 ENCOUNTER — Ambulatory Visit: Payer: Medicaid Other | Admitting: Gastroenterology

## 2016-02-13 ENCOUNTER — Encounter: Payer: Self-pay | Admitting: Gastroenterology

## 2016-02-13 NOTE — Telephone Encounter (Signed)
I left Vm for Kris Mouton for a return call since pt missed her appt today.

## 2016-02-13 NOTE — Telephone Encounter (Signed)
I TOOK THE LETTER OUT OF THE MAIL.  LET ME KNOW IF IT NEEDS TO BE MAILED

## 2016-02-13 NOTE — Telephone Encounter (Signed)
PATIENT WAS A NO SHOW AND LETTER SENT  °

## 2016-02-13 NOTE — Telephone Encounter (Signed)
Patient's short-term prognosis was not good when last seen in hospital. Can we reach out to her contact to see her status? I would hate for them to receive a letter if she has since passed away.

## 2016-02-15 NOTE — Telephone Encounter (Signed)
LMOM again for a return call from [place].

## 2016-02-16 NOTE — Telephone Encounter (Signed)
Noted. I have tried to call myself. I would like to get verification of her status. Do not send letter.

## 2016-02-16 NOTE — Telephone Encounter (Signed)
Forwarding to Gerrit Halls, NP for FYI, that I have not been able to find out anything.

## 2016-02-21 ENCOUNTER — Telehealth: Payer: Self-pay

## 2016-02-21 NOTE — Telephone Encounter (Signed)
I'm sorry to hear this. Will copy Dr. Darrick PennaFields for FYI.

## 2016-02-21 NOTE — Telephone Encounter (Signed)
REVIEWED. CANCEL ALL FUTURE APPTS.

## 2016-02-21 NOTE — Telephone Encounter (Signed)
Mr.Garland called to inform us that she passed away on .

## 2016-02-21 NOTE — Progress Notes (Signed)
REVIEWED-NO ADDITIONAL RECOMMENDATIONS. 

## 2016-06-20 IMAGING — DX DG CHEST 2V
2 series · 2 of 2 positions shown · non-contrast
Comparison: CT abdomen pelvis 08/08/2015.

CLINICAL DATA: Cough today.

EXAM:
CHEST - 2 VIEW

[chest lat]
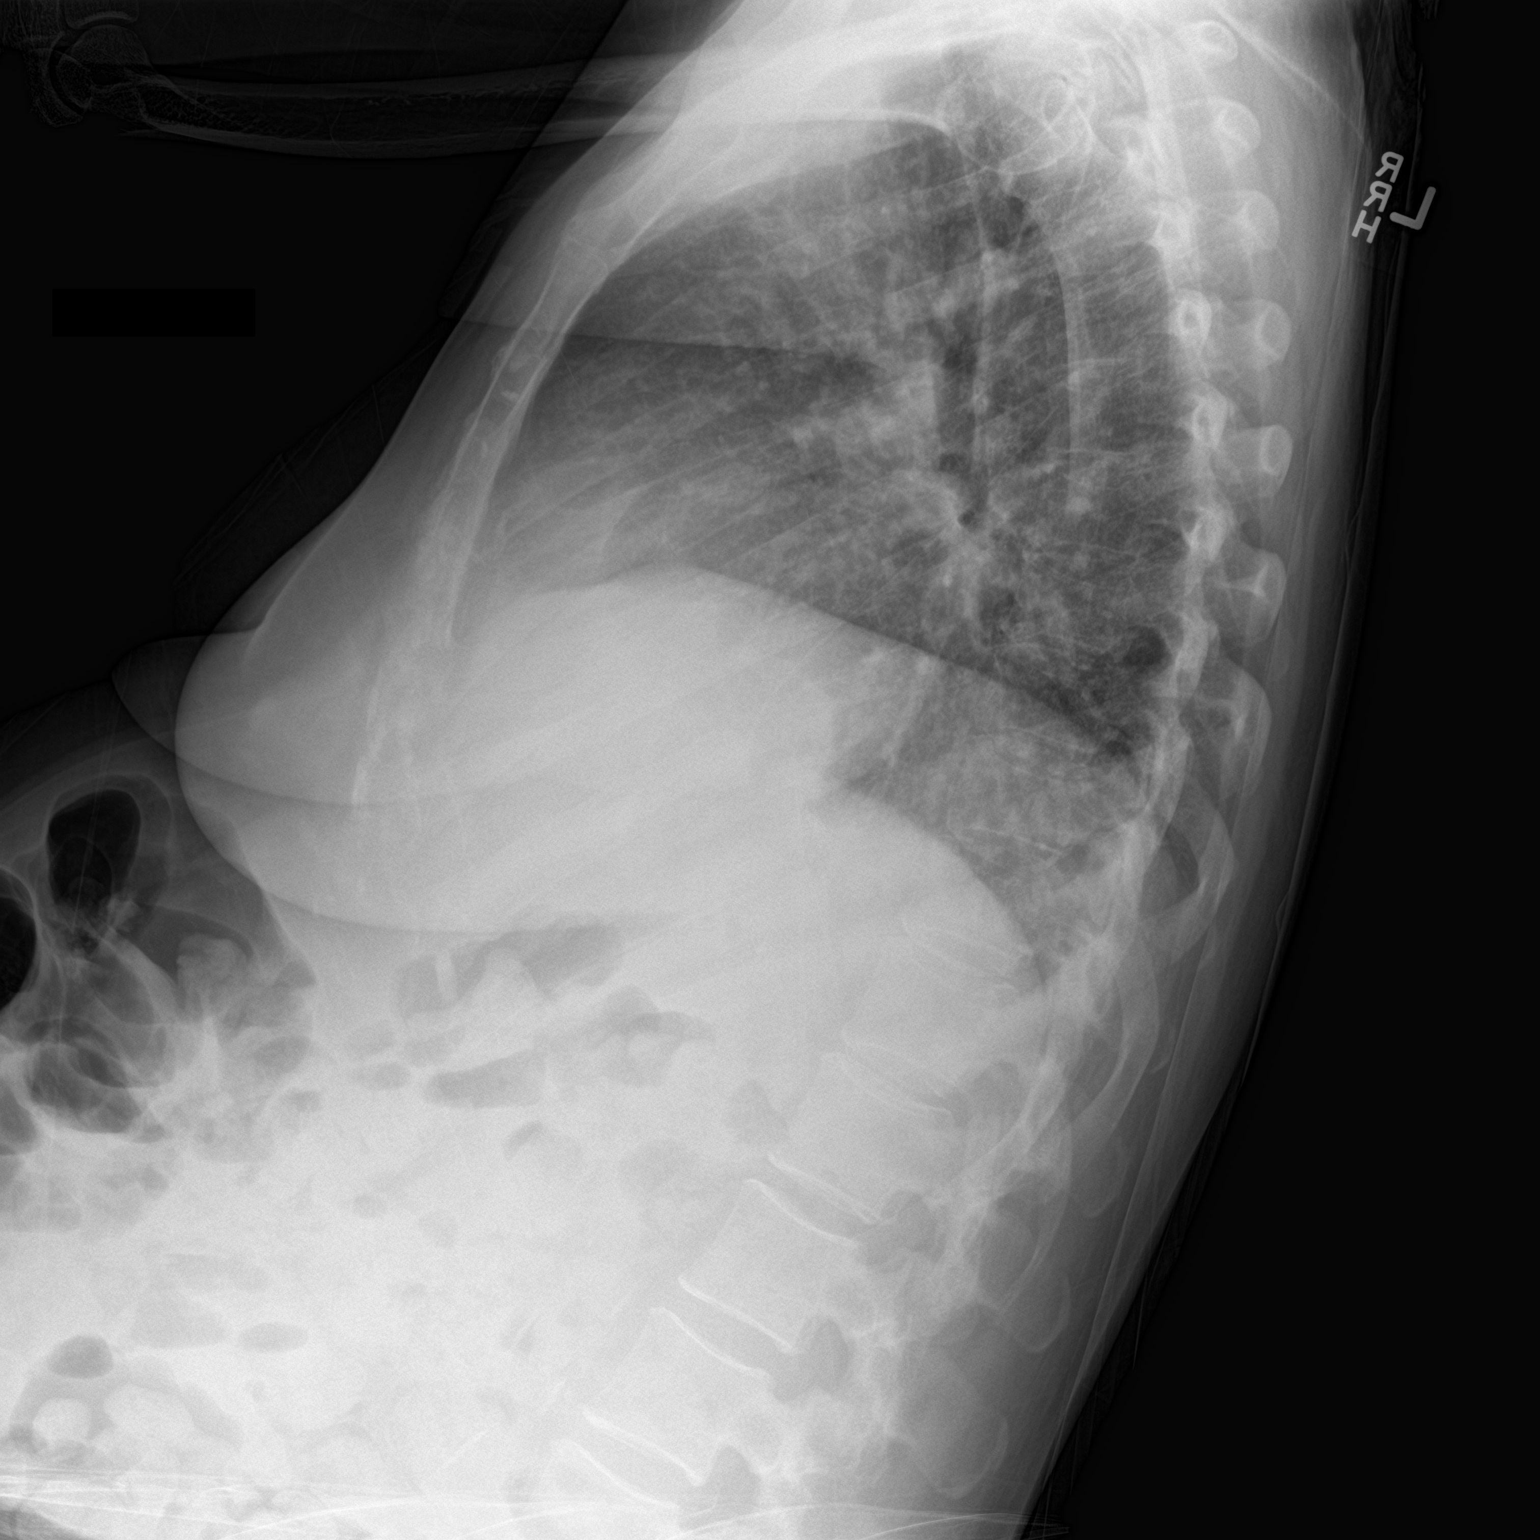

[chest ap strecther]
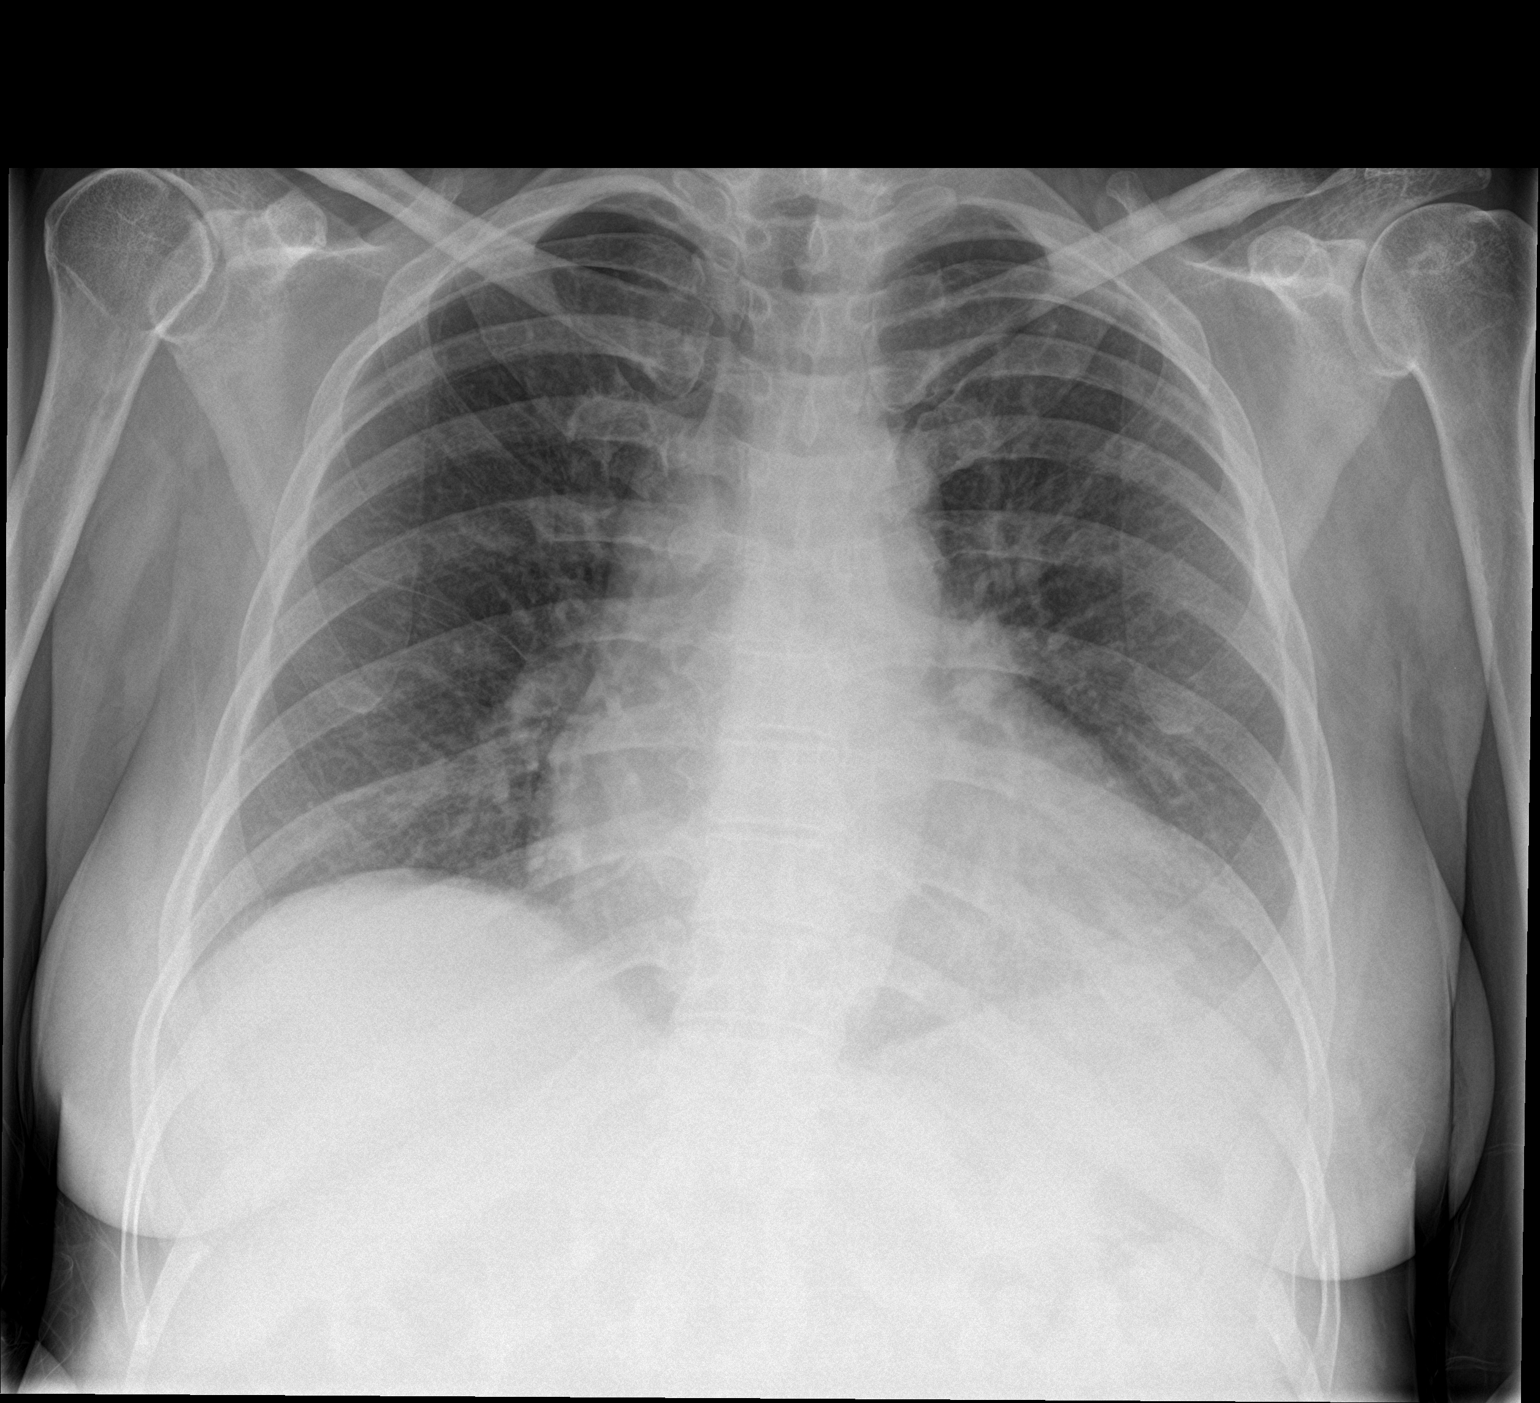

[2 of 2 positions shown; findings below may reference images not displayed]

FINDINGS: The heart is mildly enlarged, exaggerated by low lung volumes.
Asymmetric left basilar airspace disease is present. There is no
edema or effusion to suggest failure. The visualized soft tissues
and bony thorax are unremarkable.
IMPRESSION: 1. Borderline cardiomegaly without failure.
2. Asymmetric left basilar airspace disease is concerning for
pneumonia.

## 2016-06-20 IMAGING — CT CT HEAD W/O CM
1 series · 16 of 30 positions shown, 20 images · non-contrast
Comparison: None.

CLINICAL DATA: Two day history of headache.  Altered mental status

EXAM:
CT HEAD WITHOUT CONTRAST
TECHNIQUE: Contiguous axial images were obtained from the base of the skull
through the vertex without intravenous contrast.

[Series 2: headseq 4.8 h37s · axial · 0.47mm/px · z∈[+114,+251]mm · 16 of 30 slices shown, 20 images]
[im 2/30  brain]
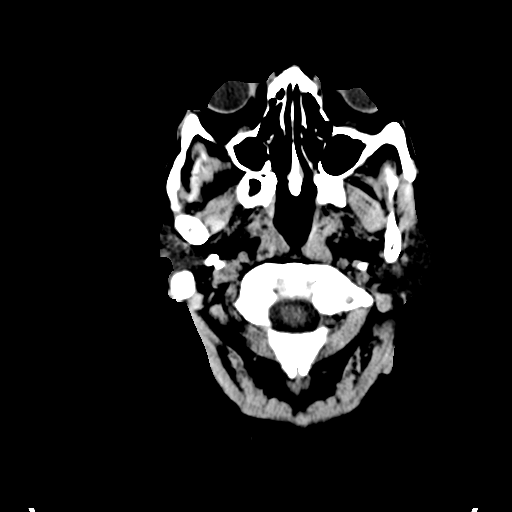
[im 2/30  bone]
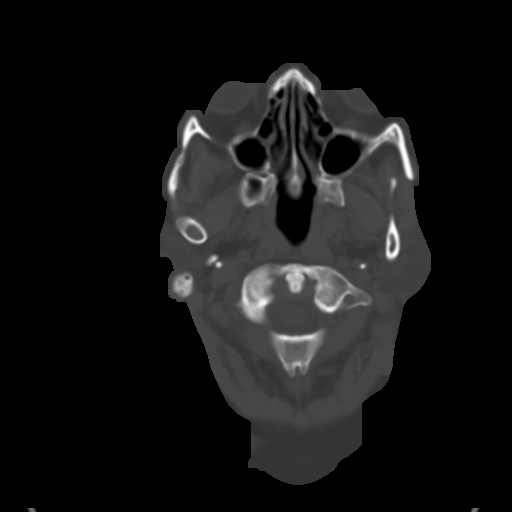
[im 4/30  brain]
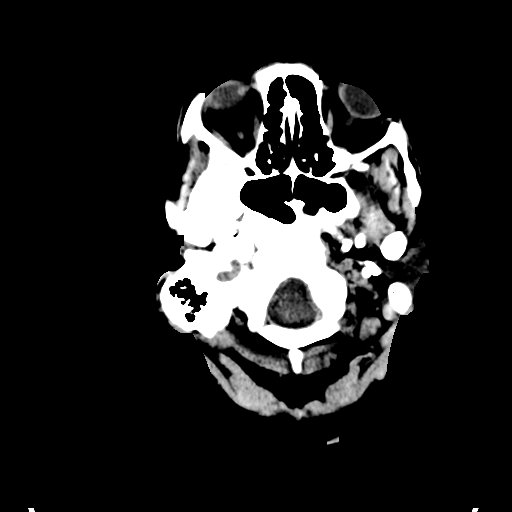
[im 6/30  brain]
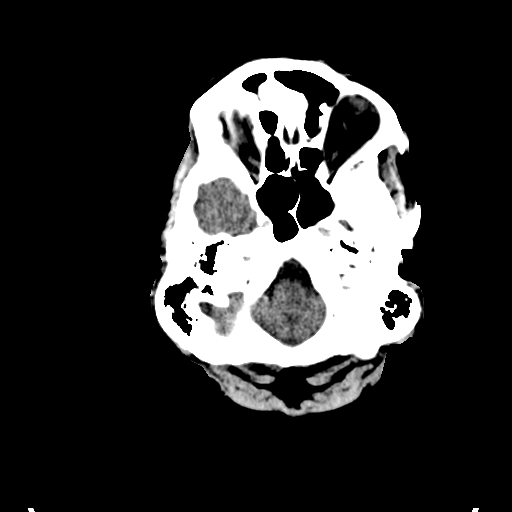
[im 8/30  brain]
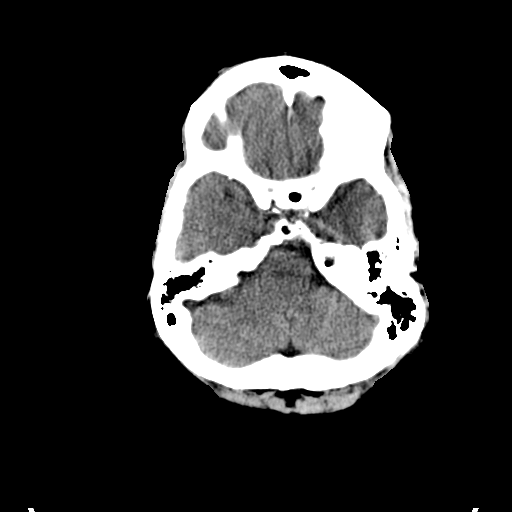
[im 9/30  brain]
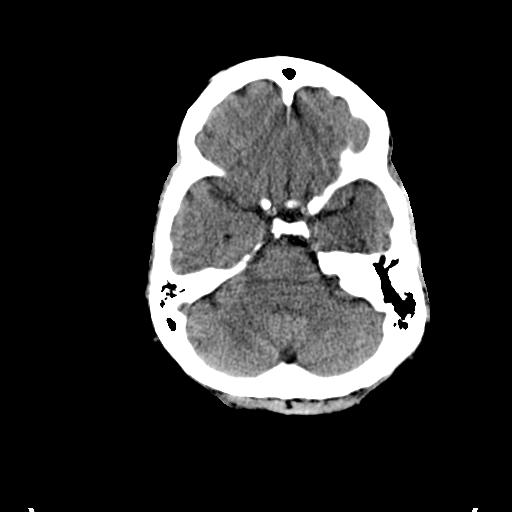
[im 9/30  bone]
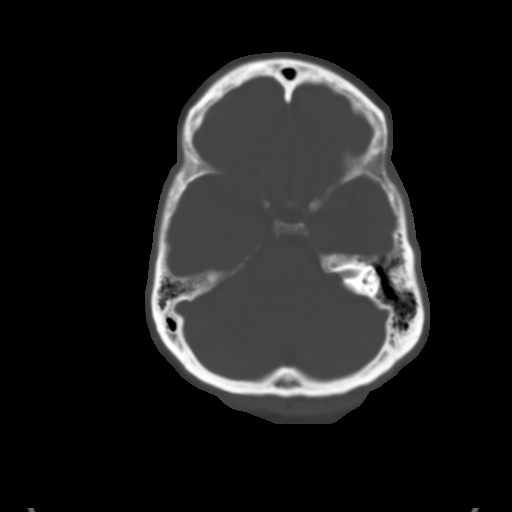
[im 11/30  brain]
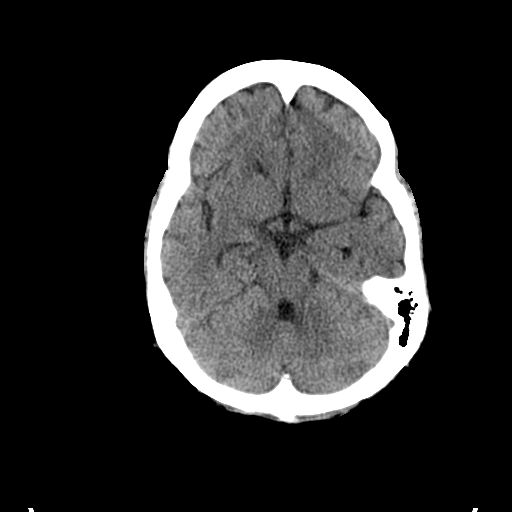
[im 13/30  brain]
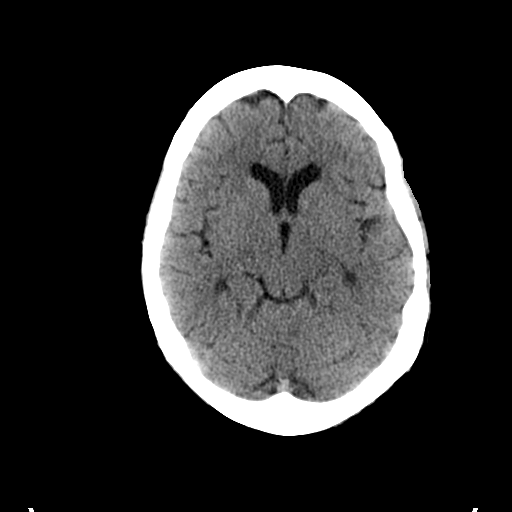
[im 15/30  brain]
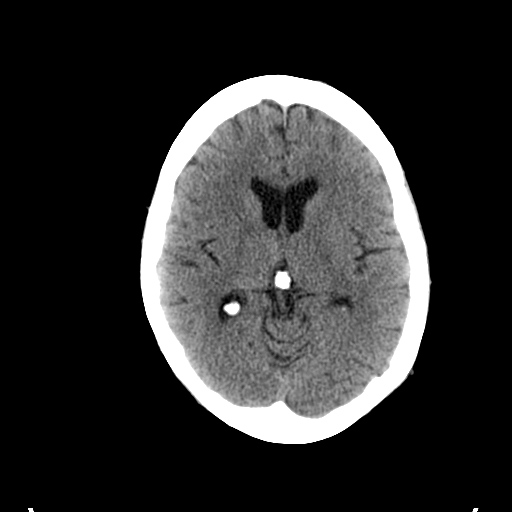
[im 16/30  brain]
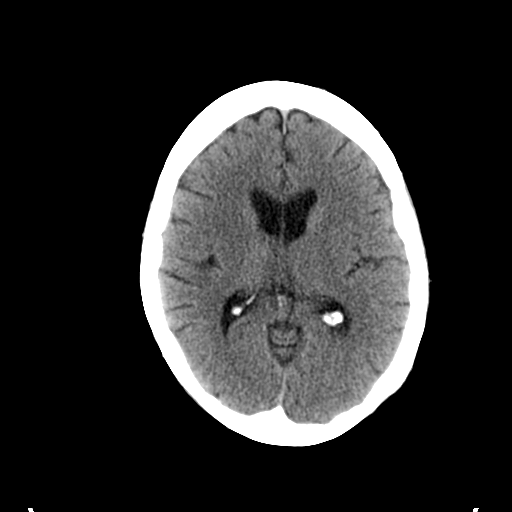
[im 16/30  bone]
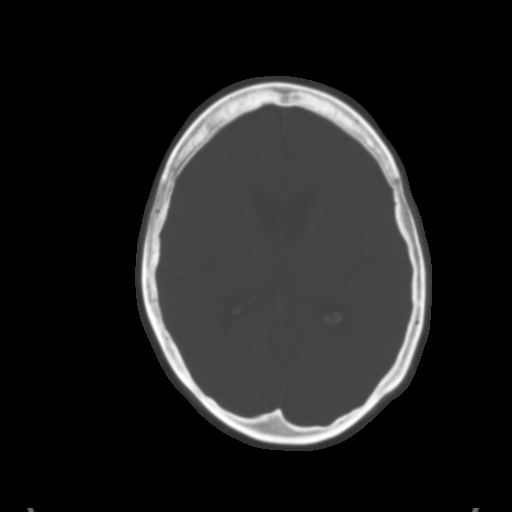
[im 18/30  brain]
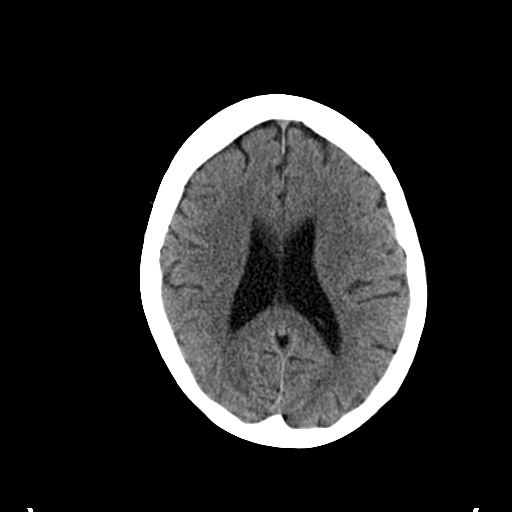
[im 20/30  brain]
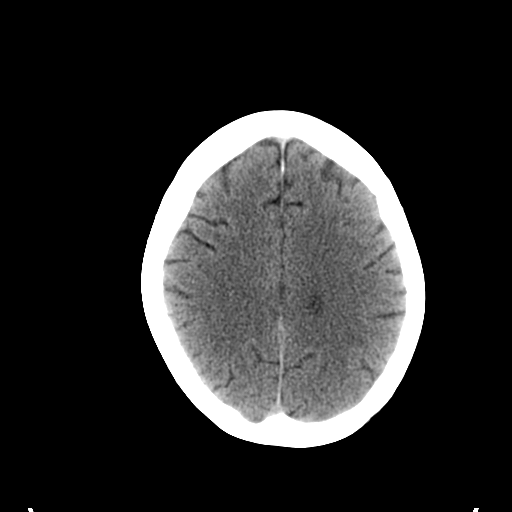
[im 22/30  brain]
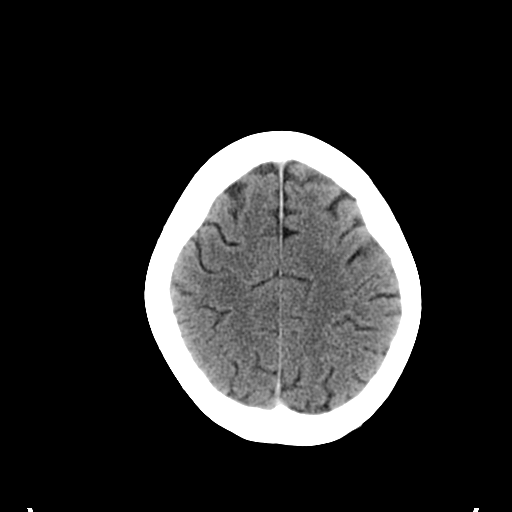
[im 23/30  brain]
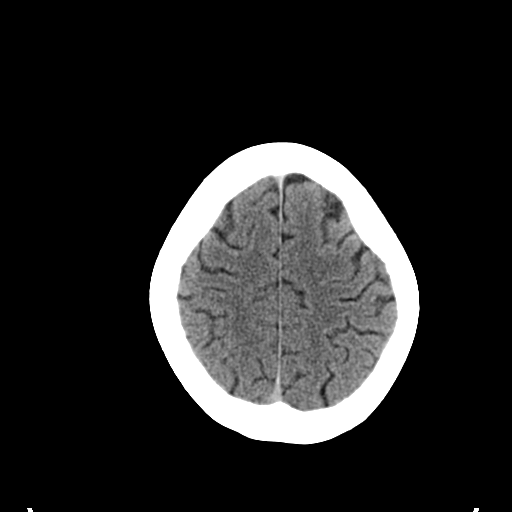
[im 23/30  bone]
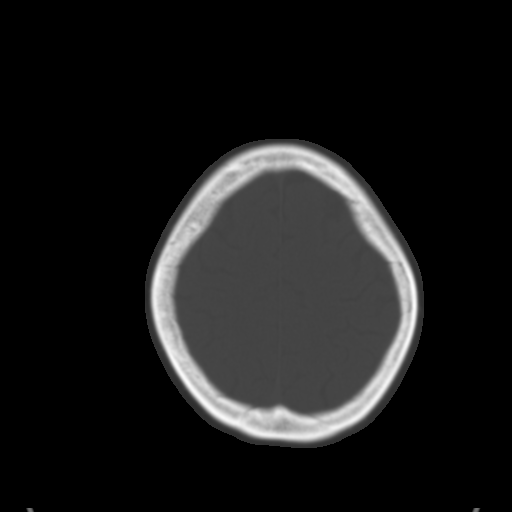
[im 25/30  brain]
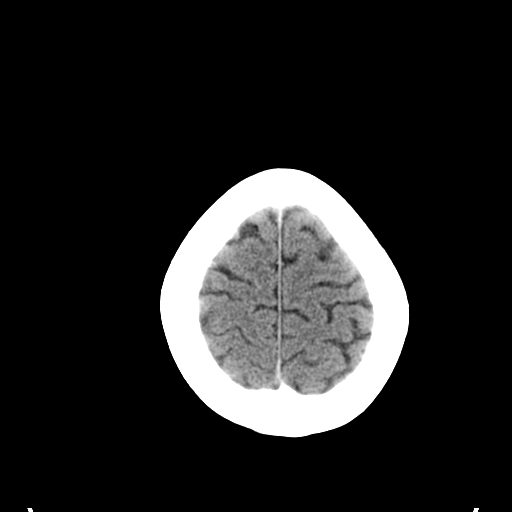
[im 27/30  brain]
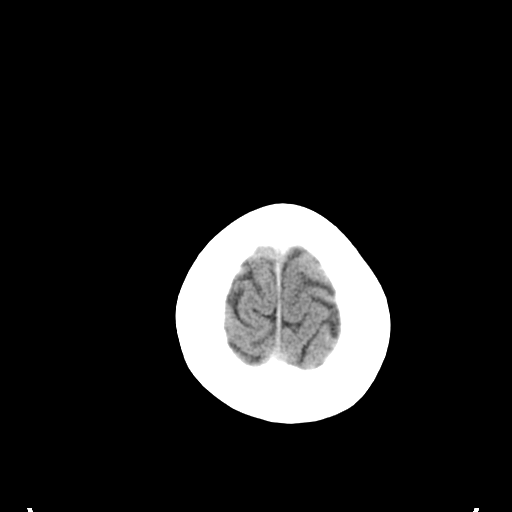
[im 29/30  brain]
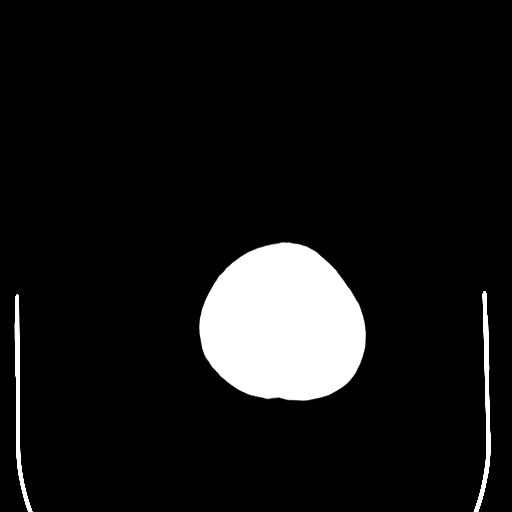

[16 of 30 positions shown; findings below may reference images not displayed]

FINDINGS: The ventricles and sulci appear normal for age. There is no
intracranial mass, hemorrhage, extra-axial fluid collection, or
midline shift. The gray-white compartments appear within normal
limits. No acute infarct evident. The bony calvarium appears intact.
The mastoid air cells are clear.
IMPRESSION: Study within normal limits.

## 2019-11-22 NOTE — Progress Notes (Signed)
REVIEWED-NO ADDITIONAL RECOMMENDATIONS.
